# Patient Record
Sex: Female | Born: 1970 | Race: Black or African American | Hispanic: No | Marital: Married | Smoking: Never smoker
Health system: Southern US, Community
[De-identification: ages and names within clinical notes are randomized; demographics above are authoritative.]

## PROBLEM LIST (undated history)

## (undated) DIAGNOSIS — D649 Anemia, unspecified: Secondary | ICD-10-CM

## (undated) HISTORY — PX: ABDOMINAL HYSTERECTOMY: SHX81

## (undated) HISTORY — PX: CHOLECYSTECTOMY: SHX55

---

## 1997-10-31 ENCOUNTER — Ambulatory Visit (HOSPITAL_COMMUNITY): Admission: RE | Admit: 1997-10-31 | Discharge: 1997-11-01 | Payer: Self-pay | Admitting: General Surgery

## 1999-02-17 ENCOUNTER — Other Ambulatory Visit: Admission: RE | Admit: 1999-02-17 | Discharge: 1999-02-17 | Payer: Self-pay | Admitting: Obstetrics and Gynecology

## 1999-02-18 ENCOUNTER — Ambulatory Visit (HOSPITAL_COMMUNITY): Admission: RE | Admit: 1999-02-18 | Discharge: 1999-02-18 | Payer: Self-pay | Admitting: Obstetrics and Gynecology

## 1999-07-20 ENCOUNTER — Other Ambulatory Visit: Admission: RE | Admit: 1999-07-20 | Discharge: 1999-07-20 | Payer: Self-pay | Admitting: Obstetrics and Gynecology

## 1999-12-31 ENCOUNTER — Other Ambulatory Visit: Admission: RE | Admit: 1999-12-31 | Discharge: 1999-12-31 | Payer: Self-pay | Admitting: Obstetrics and Gynecology

## 2000-07-14 ENCOUNTER — Inpatient Hospital Stay (HOSPITAL_COMMUNITY): Admission: AD | Admit: 2000-07-14 | Discharge: 2000-07-16 | Payer: Self-pay | Admitting: Obstetrics and Gynecology

## 2000-08-12 ENCOUNTER — Other Ambulatory Visit: Admission: RE | Admit: 2000-08-12 | Discharge: 2000-08-12 | Payer: Self-pay | Admitting: Obstetrics and Gynecology

## 2001-11-01 ENCOUNTER — Other Ambulatory Visit: Admission: RE | Admit: 2001-11-01 | Discharge: 2001-11-01 | Payer: Self-pay | Admitting: Obstetrics and Gynecology

## 2002-12-27 ENCOUNTER — Other Ambulatory Visit: Admission: RE | Admit: 2002-12-27 | Discharge: 2002-12-27 | Payer: Self-pay | Admitting: Obstetrics and Gynecology

## 2004-08-13 ENCOUNTER — Other Ambulatory Visit: Admission: RE | Admit: 2004-08-13 | Discharge: 2004-08-13 | Payer: Self-pay | Admitting: Obstetrics and Gynecology

## 2005-05-04 ENCOUNTER — Encounter: Admission: RE | Admit: 2005-05-04 | Discharge: 2005-05-04 | Payer: Self-pay | Admitting: Internal Medicine

## 2012-04-30 ENCOUNTER — Emergency Department (HOSPITAL_COMMUNITY): Admission: EM | Admit: 2012-04-30 | Discharge: 2012-04-30 | Discharge status: Absent without leave | Payer: Self-pay

## 2013-05-28 ENCOUNTER — Encounter (HOSPITAL_COMMUNITY): Payer: Self-pay

## 2013-05-31 NOTE — Patient Instructions (Addendum)
Your procedure is scheduled on: 06/11/2013  Enter through the Main Entrance of [facility] at: 0600AM  Pick up the phone at the desk and dial [phone].  Call this number if you have problems the morning of surgery: [phone].  Remember: Do NOT eat food: AFTER MIDNIGHT 06/10/2013 Do NOT drink clear liquids after: AFTER MIDNIGHT 06/10/2013 Take these medicines the morning of surgery with a SIP OF WATER: NONE  Do NOT wear jewelry (body piercing), make-up, or nail polish. Do NOT wear lotions, powders, or perfumes.  You may wear deoderant. Do NOT shave for 48 hours prior to surgery. Do NOT bring valuables to the hospital. Contacts, dentures, or bridgework may not be worn into surgery. Leave suitcase in car.  After surgery it may be brought to your room.  For patients admitted to the hospital, checkout time is 11:00 AM the day of discharge.

## 2013-06-01 ENCOUNTER — Encounter (HOSPITAL_COMMUNITY): Payer: Self-pay

## 2013-06-01 ENCOUNTER — Encounter (HOSPITAL_COMMUNITY)
Admission: RE | Admit: 2013-06-01 | Discharge: 2013-06-01 | Disposition: A | Discharge status: Home / Self Care | Payer: BC Managed Care – PPO | Source: Ambulatory Visit | Attending: Obstetrics and Gynecology | Admitting: Obstetrics and Gynecology

## 2013-06-01 DIAGNOSIS — Z01818 Encounter for other preprocedural examination: Secondary | ICD-10-CM | POA: Insufficient documentation

## 2013-06-01 DIAGNOSIS — Z01812 Encounter for preprocedural laboratory examination: Secondary | ICD-10-CM | POA: Insufficient documentation

## 2013-06-01 HISTORY — DX: Anemia, unspecified: D64.9

## 2013-06-01 LAB — CBC
Hemoglobin: 11.5 g/dL — ABNORMAL LOW (ref 12.0–15.0)
MCH: 25.3 pg — ABNORMAL LOW (ref 26.0–34.0)
MCHC: 32.8 g/dL (ref 30.0–36.0)
MCV: 77.1 fL — ABNORMAL LOW (ref 78.0–100.0)
Platelets: 222 10*3/uL (ref 150–400)
RBC: 4.55 MIL/uL (ref 3.87–5.11)
RDW: 17.6 % — ABNORMAL HIGH (ref 11.5–15.5)
WBC: 5.2 10*3/uL (ref 4.0–10.5)

## 2013-06-10 NOTE — H&P (Addendum)
  [name]   [id number]   05/29/13  S: Ms. Tanya Harrison is a 42 year old G5P2.  Husband has had a vasectomy.  She has been having worsening problems with abnormal uterine bleeding, menorrhagia, which is incapacitating at times.  She does have fibroids approximately 10 weeks' size with sonohysterogram showing a 3.5 cm with submucosal component.  Continues to have problems with anemia with a hemoglobin of 9.2.  With no further childbearing desires, she desires definitive surgical intervention and requests hysterectomy.  She does wish preservation of the ovaries, and she presents for this.  Past medical history:  She has had 2 cesarean sections.  She does have anemia most likely due to her menorrhagia.  She does take iron on a daily basis.  She has no known drug allergies. O: Blood pressure 128/84.  Heart regular rate and rhythm.  Lungs are clear to auscultation bilaterally.  Abdomen is nondistended, nontender.  Pelvic exam:  Uterus is anteverted, mobile, approximately 10 weeks' size.  No significant cystocele or rectocele. A/P: Menorrhagia, fibroids, abnormal uterine bleeding and anemia.  Patient desires definitive surgical intervention.  She requests hysterectomy.  Plan laparoscopically assisted vaginal hysterectomy, possible total abdominal hysterectomy with preservation of the ovaries.  Discussed the risks and benefits of this procedure at length, its success, complications.  The risks include but are not limited to the risk of infection, bleeding, damage to the ovaries, bowel, bladder or ureters.  Risk of blood transfusion.  Possibility of having to convert to laparotomy.  She does give her informed consent.  All of her questions are answered. Tanya Kid Rana Snare, MD/tch   This patient has been seen and examined.   All of her questions were answered.  Labs and vital signs reviewed.  Informed consent has been obtained.  The History and Physical is current. 06/11/13 0730 DL

## 2013-06-11 ENCOUNTER — Encounter (HOSPITAL_COMMUNITY): Payer: BC Managed Care – PPO | Admitting: Anesthesiology

## 2013-06-11 ENCOUNTER — Ambulatory Visit (HOSPITAL_COMMUNITY): Payer: BC Managed Care – PPO | Admitting: Anesthesiology

## 2013-06-11 ENCOUNTER — Encounter (HOSPITAL_COMMUNITY): Payer: Self-pay | Admitting: Anesthesiology

## 2013-06-11 ENCOUNTER — Observation Stay (HOSPITAL_COMMUNITY)
Admission: RE | Admit: 2013-06-11 | Discharge: 2013-06-12 | Disposition: A | Discharge status: Home / Self Care | Payer: BC Managed Care – PPO | Source: Ambulatory Visit | Attending: Obstetrics and Gynecology | Admitting: Obstetrics and Gynecology

## 2013-06-11 ENCOUNTER — Encounter (HOSPITAL_COMMUNITY)
Admission: RE | Disposition: A | Discharge status: Home / Self Care | Payer: Self-pay | Source: Ambulatory Visit | Attending: Obstetrics and Gynecology

## 2013-06-11 DIAGNOSIS — D250 Submucous leiomyoma of uterus: Secondary | ICD-10-CM | POA: Insufficient documentation

## 2013-06-11 DIAGNOSIS — D251 Intramural leiomyoma of uterus: Secondary | ICD-10-CM | POA: Insufficient documentation

## 2013-06-11 DIAGNOSIS — N920 Excessive and frequent menstruation with regular cycle: Principal | ICD-10-CM | POA: Insufficient documentation

## 2013-06-11 DIAGNOSIS — D649 Anemia, unspecified: Secondary | ICD-10-CM | POA: Insufficient documentation

## 2013-06-11 DIAGNOSIS — Z90710 Acquired absence of both cervix and uterus: Secondary | ICD-10-CM

## 2013-06-11 HISTORY — PX: LAPAROSCOPIC ASSISTED VAGINAL HYSTERECTOMY: SHX5398

## 2013-06-11 LAB — PREGNANCY, URINE: Preg Test, Ur: NEGATIVE

## 2013-06-11 SURGERY — HYSTERECTOMY, VAGINAL, LAPAROSCOPY-ASSISTED
Anesthesia: General | Site: Vagina | Wound class: Clean Contaminated

## 2013-06-11 MED ORDER — FENTANYL CITRATE 0.05 MG/ML IJ SOLN
INTRAMUSCULAR | Status: DC | PRN
  Administered 2013-06-11 (×5): 50 ug via INTRAVENOUS

## 2013-06-11 MED ORDER — NALOXONE HCL 0.4 MG/ML IJ SOLN: 0.4000 mg | INTRAMUSCULAR | Status: DC | PRN

## 2013-06-11 MED ORDER — MENTHOL 3 MG MT LOZG: 1.0000 | LOZENGE | OROMUCOSAL | Status: DC | PRN

## 2013-06-11 MED ORDER — PROPOFOL 10 MG/ML IV BOLUS
INTRAVENOUS | Status: DC | PRN
  Administered 2013-06-11: 80 mg via INTRAVENOUS

## 2013-06-11 MED ORDER — SODIUM CHLORIDE 0.9 % IJ SOLN: 9.0000 mL | INTRAMUSCULAR | Status: DC | PRN

## 2013-06-11 MED ORDER — ROCURONIUM BROMIDE 100 MG/10ML IV SOLN
INTRAVENOUS | Status: AC
  Filled 2013-06-11: qty 1

## 2013-06-11 MED ORDER — MIDAZOLAM HCL 2 MG/2ML IJ SOLN
INTRAMUSCULAR | Status: DC | PRN
  Administered 2013-06-11: 2 mg via INTRAVENOUS

## 2013-06-11 MED ORDER — DIPHENHYDRAMINE HCL 12.5 MG/5ML PO ELIX: 12.5000 mg | ORAL_SOLUTION | Freq: Four times a day (QID) | ORAL | Status: DC | PRN

## 2013-06-11 MED ORDER — BUPIVACAINE HCL (PF) 0.25 % IJ SOLN
INTRAMUSCULAR | Status: DC | PRN
  Administered 2013-06-11: 10 mL

## 2013-06-11 MED ORDER — DEXAMETHASONE SODIUM PHOSPHATE 10 MG/ML IJ SOLN
INTRAMUSCULAR | Status: DC | PRN
  Administered 2013-06-11: 10 mg via INTRAVENOUS

## 2013-06-11 MED ORDER — PHENYLEPHRINE HCL 10 MG/ML IJ SOLN
INTRAMUSCULAR | Status: DC | PRN
  Administered 2013-06-11 (×2): 80 ug via INTRAVENOUS

## 2013-06-11 MED ORDER — BUPIVACAINE HCL (PF) 0.25 % IJ SOLN
INTRAMUSCULAR | Status: AC
  Filled 2013-06-11: qty 10

## 2013-06-11 MED ORDER — PROPOFOL 10 MG/ML IV EMUL
INTRAVENOUS | Status: AC
  Filled 2013-06-11: qty 20

## 2013-06-11 MED ORDER — KETOROLAC TROMETHAMINE 30 MG/ML IJ SOLN
INTRAMUSCULAR | Status: AC
  Filled 2013-06-11: qty 1

## 2013-06-11 MED ORDER — LIDOCAINE HCL (CARDIAC) 20 MG/ML IV SOLN
INTRAVENOUS | Status: DC | PRN
  Administered 2013-06-11: 30 mg via INTRAVENOUS

## 2013-06-11 MED ORDER — HYDROMORPHONE HCL PF 1 MG/ML IJ SOLN
INTRAMUSCULAR | Status: AC
  Filled 2013-06-11: qty 1

## 2013-06-11 MED ORDER — IBUPROFEN 600 MG PO TABS
600.0000 mg | ORAL_TABLET | Freq: Four times a day (QID) | ORAL | Status: DC | PRN
  Administered 2013-06-11 – 2013-06-12 (×2): 600 mg via ORAL
  Filled 2013-06-11 (×2): qty 1

## 2013-06-11 MED ORDER — LIDOCAINE HCL (CARDIAC) 20 MG/ML IV SOLN
INTRAVENOUS | Status: AC
  Filled 2013-06-11: qty 5

## 2013-06-11 MED ORDER — ONDANSETRON HCL 4 MG/2ML IJ SOLN
INTRAMUSCULAR | Status: DC | PRN
  Administered 2013-06-11: 4 mg via INTRAVENOUS

## 2013-06-11 MED ORDER — FENTANYL CITRATE 0.05 MG/ML IJ SOLN
INTRAMUSCULAR | Status: AC
  Filled 2013-06-11: qty 5

## 2013-06-11 MED ORDER — GLYCOPYRROLATE 0.2 MG/ML IJ SOLN
INTRAMUSCULAR | Status: DC | PRN
  Administered 2013-06-11: 0.3 mg via INTRAVENOUS

## 2013-06-11 MED ORDER — OXYCODONE-ACETAMINOPHEN 5-325 MG PO TABS: 1.0000 | ORAL_TABLET | ORAL | Status: DC | PRN

## 2013-06-11 MED ORDER — GLYCOPYRROLATE 0.2 MG/ML IJ SOLN
INTRAMUSCULAR | Status: AC
  Filled 2013-06-11: qty 3

## 2013-06-11 MED ORDER — PHENYLEPHRINE 40 MCG/ML (10ML) SYRINGE FOR IV PUSH (FOR BLOOD PRESSURE SUPPORT)
PREFILLED_SYRINGE | INTRAVENOUS | Status: AC
  Filled 2013-06-11: qty 5

## 2013-06-11 MED ORDER — DEXAMETHASONE SODIUM PHOSPHATE 10 MG/ML IJ SOLN
INTRAMUSCULAR | Status: AC
  Filled 2013-06-11: qty 1

## 2013-06-11 MED ORDER — LACTATED RINGERS IV SOLN
INTRAVENOUS | Status: DC
  Administered 2013-06-11 (×2): via INTRAVENOUS

## 2013-06-11 MED ORDER — ONDANSETRON HCL 4 MG/2ML IJ SOLN: 4.0000 mg | Freq: Four times a day (QID) | INTRAMUSCULAR | Status: DC | PRN

## 2013-06-11 MED ORDER — MIDAZOLAM HCL 2 MG/2ML IJ SOLN
INTRAMUSCULAR | Status: AC
  Filled 2013-06-11: qty 2

## 2013-06-11 MED ORDER — HYDROMORPHONE HCL PF 1 MG/ML IJ SOLN: 0.2000 mg | INTRAMUSCULAR | Status: DC | PRN

## 2013-06-11 MED ORDER — DIPHENHYDRAMINE HCL 50 MG/ML IJ SOLN: 12.5000 mg | Freq: Four times a day (QID) | INTRAMUSCULAR | Status: DC | PRN

## 2013-06-11 MED ORDER — NEOSTIGMINE METHYLSULFATE 1 MG/ML IJ SOLN
INTRAMUSCULAR | Status: DC | PRN
  Administered 2013-06-11: 1.5 mg via INTRAVENOUS

## 2013-06-11 MED ORDER — LACTATED RINGERS IR SOLN
Status: DC | PRN
  Administered 2013-06-11: 1

## 2013-06-11 MED ORDER — ZOLPIDEM TARTRATE 5 MG PO TABS: 5.0000 mg | ORAL_TABLET | Freq: Every evening | ORAL | Status: DC | PRN

## 2013-06-11 MED ORDER — ONDANSETRON HCL 4 MG/2ML IJ SOLN
INTRAMUSCULAR | Status: AC
  Filled 2013-06-11: qty 2

## 2013-06-11 MED ORDER — HYDROMORPHONE HCL PF 1 MG/ML IJ SOLN
0.2500 mg | INTRAMUSCULAR | Status: DC | PRN
  Administered 2013-06-11: 0.5 mg via INTRAVENOUS

## 2013-06-11 MED ORDER — ROCURONIUM BROMIDE 100 MG/10ML IV SOLN
INTRAVENOUS | Status: DC | PRN
  Administered 2013-06-11: 50 mg via INTRAVENOUS

## 2013-06-11 MED ORDER — KETOROLAC TROMETHAMINE 30 MG/ML IJ SOLN
INTRAMUSCULAR | Status: DC | PRN
  Administered 2013-06-11: 30 mg via INTRAVENOUS

## 2013-06-11 MED ORDER — DEXTROSE 5 % IV SOLN
2.0000 g | INTRAVENOUS | Status: AC
  Administered 2013-06-11: 2 g via INTRAVENOUS
  Filled 2013-06-11: qty 2

## 2013-06-11 MED ORDER — BUPIVACAINE HCL (PF) 0.25 % IJ SOLN
INTRAMUSCULAR | Status: AC
  Filled 2013-06-11: qty 30

## 2013-06-11 MED ORDER — DEXTROSE-NACL 5-0.45 % IV SOLN
INTRAVENOUS | Status: DC
  Administered 2013-06-11: 13:00:00 via INTRAVENOUS

## 2013-06-11 MED ORDER — HYDROMORPHONE 0.3 MG/ML IV SOLN
INTRAVENOUS | Status: DC
  Administered 2013-06-11: 0.599 mg via INTRAVENOUS
  Administered 2013-06-11: 0.6 mg via INTRAVENOUS
  Administered 2013-06-11: 11:00:00 via INTRAVENOUS
  Filled 2013-06-11: qty 25

## 2013-06-11 MED ORDER — NEOSTIGMINE METHYLSULFATE 1 MG/ML IJ SOLN
INTRAMUSCULAR | Status: AC
  Filled 2013-06-11: qty 1

## 2013-06-11 SURGICAL SUPPLY — 45 items
ADH SKN CLS APL DERMABOND .7 (GAUZE/BANDAGES/DRESSINGS) ×1
ADH SKN CLS LQ APL DERMABOND (GAUZE/BANDAGES/DRESSINGS) ×1
BLADE SURG 15 STRL LF C SS BP (BLADE) ×1 IMPLANT
BLADE SURG 15 STRL SS (BLADE) ×2
CABLE HIGH FREQUENCY MONO STRZ (ELECTRODE) IMPLANT
CATH ROBINSON RED A/P 16FR (CATHETERS) ×2 IMPLANT
CLOTH BEACON ORANGE TIMEOUT ST (SAFETY) ×2 IMPLANT
CONT PATH 16OZ SNAP LID 3702 (MISCELLANEOUS) ×2 IMPLANT
COVER TABLE BACK 60X90 (DRAPES) ×2 IMPLANT
DECANTER SPIKE VIAL GLASS SM (MISCELLANEOUS) IMPLANT
DERMABOND ADHESIVE PROPEN (GAUZE/BANDAGES/DRESSINGS) ×1
DERMABOND ADVANCED (GAUZE/BANDAGES/DRESSINGS) ×1
DERMABOND ADVANCED .7 DNX12 (GAUZE/BANDAGES/DRESSINGS) ×1 IMPLANT
DERMABOND ADVANCED .7 DNX6 (GAUZE/BANDAGES/DRESSINGS) IMPLANT
DRSG COVADERM PLUS 2X2 (GAUZE/BANDAGES/DRESSINGS) ×1 IMPLANT
ELECT LIGASURE LONG (ELECTRODE) ×2 IMPLANT
ELECT REM PT RETURN 9FT ADLT (ELECTROSURGICAL) ×2
ELECTRODE REM PT RTRN 9FT ADLT (ELECTROSURGICAL) IMPLANT
FORCEPS CUTTING 45CM 5MM (CUTTING FORCEPS) ×2 IMPLANT
GLOVE BIO SURGEON STRL SZ8 (GLOVE) ×2 IMPLANT
GLOVE BIOGEL PI IND STRL 6.5 (GLOVE) ×1 IMPLANT
GLOVE BIOGEL PI INDICATOR 6.5 (GLOVE) ×1
GLOVE SURG ORTHO 8.0 STRL STRW (GLOVE) ×6 IMPLANT
GOWN STRL REIN XL XLG (GOWN DISPOSABLE) ×8 IMPLANT
NEEDLE INSUFFLATION 120MM (ENDOMECHANICALS) ×2 IMPLANT
NS IRRIG 1000ML POUR BTL (IV SOLUTION) ×2 IMPLANT
PACK LAVH (CUSTOM PROCEDURE TRAY) ×2 IMPLANT
PROTECTOR NERVE ULNAR (MISCELLANEOUS) ×2 IMPLANT
SET IRRIG TUBING LAPAROSCOPIC (IRRIGATION / IRRIGATOR) ×1 IMPLANT
SOLUTION ELECTROLUBE (MISCELLANEOUS) IMPLANT
STRIP CLOSURE SKIN 1/4X3 (GAUZE/BANDAGES/DRESSINGS) IMPLANT
SUT MNCRL 0 MO-4 VIOLET 18 CR (SUTURE) ×2 IMPLANT
SUT MNCRL 0 VIOLET 6X18 (SUTURE) ×1 IMPLANT
SUT MNCRL AB 0 CT1 27 (SUTURE) ×1 IMPLANT
SUT MON AB 2-0 CT1 36 (SUTURE) IMPLANT
SUT MONOCRYL 0 6X18 (SUTURE) ×1
SUT MONOCRYL 0 MO 4 18  CR/8 (SUTURE) ×2
SUT VICRYL 0 UR6 27IN ABS (SUTURE) ×2 IMPLANT
SUT VICRYL RAPIDE 3 0 (SUTURE) ×2 IMPLANT
TOWEL OR 17X24 6PK STRL BLUE (TOWEL DISPOSABLE) ×4 IMPLANT
TRAY FOLEY CATH 14FR (SET/KITS/TRAYS/PACK) ×2 IMPLANT
TROCAR OPTI TIP 5M 100M (ENDOMECHANICALS) ×2 IMPLANT
TROCAR XCEL DIL TIP R 11M (ENDOMECHANICALS) ×2 IMPLANT
WARMER LAPAROSCOPE (MISCELLANEOUS) ×2 IMPLANT
WATER STERILE IRR 1000ML POUR (IV SOLUTION) ×1 IMPLANT

## 2013-06-11 NOTE — Anesthesia Postprocedure Evaluation (Signed)
Anesthesia Post Note  Patient: Tanya Harrison  Procedure(s) Performed: Procedure(s) (LRB): LAPAROSCOPIC ASSISTED VAGINAL HYSTERECTOMY (N/A)  Anesthesia type: General  Patient location: PACU  Post pain: Pain level controlled  Post assessment: Post-op Vital signs reviewed  Last Vitals:  Filed Vitals:   06/11/13 1015  BP:   Pulse: 83  Temp: 36.9 C  Resp: 16    Post vital signs: Reviewed  Level of consciousness: sedated  Complications: No apparent anesthesia complications

## 2013-06-11 NOTE — Anesthesia Postprocedure Evaluation (Signed)
Anesthesia Post Note  Patient: Tanya Harrison  Procedure(s) Performed: Procedure(s) (LRB): LAPAROSCOPIC ASSISTED VAGINAL HYSTERECTOMY (N/A)  Anesthesia type: General  Patient location: [facility]  Post pain: Pain level controlled  Post assessment: Post-op Vital signs reviewed  Last Vitals:  Filed Vitals:   06/11/13 1407  BP: 118/74  Pulse: 97  Temp: 36.9 C  Resp: 16    Post vital signs: Reviewed  Level of consciousness: sedated  Complications: No apparent anesthesia complications

## 2013-06-11 NOTE — Transfer of Care (Signed)
Immediate Anesthesia Transfer of Care Note  Patient: Tanya Harrison  Procedure(s) Performed: Procedure(s): LAPAROSCOPIC ASSISTED VAGINAL HYSTERECTOMY (N/A)  Patient Location: PACU  Anesthesia Type:General  Level of Consciousness: awake, alert , oriented and patient cooperative  Airway & Oxygen Therapy: Patient Spontanous Breathing and Patient connected to nasal cannula oxygen  Post-op Assessment: Report given to PACU RN and Post -op Vital signs reviewed and stable  Post vital signs: Reviewed and stable  Complications: No apparent anesthesia complications

## 2013-06-11 NOTE — Brief Op Note (Signed)
06/11/2013  9:08 AM  PATIENT:  Tanya Harrison  42 y.o. female  PRE-OPERATIVE DIAGNOSIS:  fibroids aub;menorrhagia;anemia  POST-OPERATIVE DIAGNOSIS:  ibroids aub;menorrhagia;anemia  PROCEDURE:  Procedure(s): LAPAROSCOPIC ASSISTED VAGINAL HYSTERECTOMY (N/A)  SURGEON:  Surgeon(s) and Role:    * Turner Daniels, MD - Primary    * Jeani Hawking, MD - Assisting  PHYSICIAN ASSISTANT:   ASSISTANTS: Grewal   ANESTHESIA:   local and general  EBL:  Total I/O In: 1500 [I.V.:1500] Out: 250 [Urine:50; Blood:200]  BLOOD ADMINISTERED:none  DRAINS: Urinary Catheter (Foley)   LOCAL MEDICATIONS USED:  MARCAINE     SPECIMEN:  Source of Specimen:  uterus  DISPOSITION OF SPECIMEN:  PATHOLOGY  COUNTS:  YES  TOURNIQUET:  * No tourniquets in log *  DICTATION: .Other Dictation: Dictation Number 1  PLAN OF CARE: Admit for overnight observation  PATIENT DISPOSITION:  PACU - hemodynamically stable.   Delay start of Pharmacological VTE agent (>24hrs) due to surgical blood loss or risk of bleeding: not applicable

## 2013-06-11 NOTE — Anesthesia Preprocedure Evaluation (Signed)

## 2013-06-12 ENCOUNTER — Encounter (HOSPITAL_COMMUNITY): Payer: Self-pay | Admitting: Obstetrics and Gynecology

## 2013-06-12 LAB — CBC
HCT: 29.6 % — ABNORMAL LOW (ref 36.0–46.0)
Hemoglobin: 9.7 g/dL — ABNORMAL LOW (ref 12.0–15.0)
MCH: 25.6 pg — ABNORMAL LOW (ref 26.0–34.0)
MCV: 78.1 fL (ref 78.0–100.0)
RBC: 3.79 MIL/uL — ABNORMAL LOW (ref 3.87–5.11)
WBC: 10.3 10*3/uL (ref 4.0–10.5)

## 2013-06-12 MED ORDER — IBUPROFEN 600 MG PO TABS: 600.0000 mg | ORAL_TABLET | Freq: Four times a day (QID) | ORAL | Status: AC | PRN

## 2013-06-12 MED ORDER — OXYCODONE-ACETAMINOPHEN 5-325 MG PO TABS: 1.0000 | ORAL_TABLET | ORAL | Status: DC | PRN

## 2013-06-12 NOTE — Discharge Summary (Signed)
Physician Discharge Summary  Patient ID: SHARMAIN LASTRA MRN: [id number] DOB/AGE: June 06, 1971 42 y.o.  Admit date: 06/11/2013 Discharge date: 06/12/2013  Admission Diagnoses:  Discharge Diagnoses:  Active Problems:   S/P laparoscopic assisted vaginal hysterectomy (LAVH)   Discharged Condition: good  Hospital Course: pt underwent uncomplicated LAVH.  Her post op care was unremarkable with quick return of bowel and bladder function.  Her pain was well controlled with motrin only and on post op D 1 was tolerating a regular diet, ambulating without difficulty and wanted d/c home  Consults: None  Significant Diagnostic Studies: labs: 9.7  Treatments: surgery: LAVH  Discharge Exam: Blood pressure 100/65, pulse 79, temperature 98.8 F (37.1 C), temperature source Oral, resp. rate 16, height 5' (1.524 m), weight 79.379 kg (175 lb), last menstrual period 05/29/2013, SpO2 96.00%. General appearance: alert, cooperative, appears stated age and no distress GI: normal findings: bowel sounds normal Incision/Wound:CD&I  Disposition:   Discharge Orders   Future Orders Complete By Expires   Call MD for:  difficulty breathing, headache or visual disturbances  As directed    Call MD for:  persistant nausea and vomiting  As directed    Call MD for:  redness, tenderness, or signs of infection (pain, swelling, redness, odor or green/yellow discharge around incision site)  As directed    Call MD for:  severe uncontrolled pain  As directed    Call MD for:  temperature >100.4  As directed    Diet general  As directed    Driving Restrictions  As directed    Comments:     No driving for 1 week   Increase activity slowly  As directed    Lifting restrictions  As directed    Comments:     No lifting anything greater than 10 pounds (if you have to ask, don't lift it)   Sexual Activity Restrictions  As directed    Comments:     Nothing in the vagina for 6 weeks       Medication List         ibuprofen 600 MG tablet  Commonly known as:  ADVIL,MOTRIN  Take 1 tablet (600 mg total) by mouth every 6 (six) hours as needed (mild pain).     INTEGRA F 125-1 MG Caps  Take 1 capsule by mouth daily.     naproxen sodium 220 MG tablet  Commonly known as:  ANAPROX  Take 440 mg by mouth daily as needed (menstrual cramps).     oxyCODONE-acetaminophen 5-325 MG per tablet  Commonly known as:  PERCOCET/ROXICET  Take 1-2 tablets by mouth every 4 (four) hours as needed for severe pain (moderate to severe pain (when tolerating fluids)).         Signed: Turner Daniels 06/12/2013, 9:31 AM

## 2013-06-12 NOTE — Op Note (Signed)
NAMEBRUNILDA, EBLE           ACCOUNT NO.:  [id number]  MEDICAL RECORD NO.:  [id number]  LOCATION:  9316                          FACILITY:  [facility]  PHYSICIAN:  Dineen Kid. Rana Snare, M.D.    DATE OF BIRTH:  1971-05-26  DATE OF PROCEDURE:  06/11/2013 DATE OF DISCHARGE:  06/12/2013                              OPERATIVE REPORT   PREOPERATIVE DIAGNOSES:  Menorrhagia, 10-week size fibroids, abnormal uterine bleeding, and anemia.  POSTOPERATIVE DIAGNOSES:  Menorrhagia, 10-week size fibroids, abnormal uterine bleeding, and anemia.  PROCEDURE:  Laparoscopic-assisted vaginal hysterectomy.  SURGEON:  Dineen Kid. Rana Snare, M.D.  ASSISTANT:  Stann Mainland. Vincente Poli, M.D.  INDICATIONS:  Ms. Feazell is a 42 year old, G5, P2.  Husband had a vasectomy.  She has had worsening problems with abnormal uterine bleeding, menorrhagia which is incapacitating at times.  She has known fibroids with the uterus of 10-week size.  Sonohysterogram shows a 3.5 cm submucosal component of fibroid.  Hemoglobin has been as low as 9.2, currently on intensive iron therapy.  She has no further childbearing desires.  She requests hysterectomy.  Previous cesarean sections x2.  We are going to attempt laparoscopic-assisted vaginal hysterectomy.  Risks and benefits were discussed at length.  Informed consent was obtained.  FINDINGS:  At the time of surgery, enlarged globular fibroid uterus of approximately 10-week size.  Normal appearing ovaries, appendix, gallbladder, and liver.  DESCRIPTION OF PROCEDURE:  After adequate analgesia, the patient was placed in dorsal lithotomy position.  She was sterilely prepped and draped.  Bladder was sterilely drained.  A Hulka tenaculum was placed on the cervix.  A 1-cm infraumbilical skin incision was made.  A Veress needle was inserted.  The abdomen was insufflated dullness to percussion.  An 11-mm trocar was inserted.  The above findings were noted by laparoscope.  A 5-mm trocar was  inserted to left of the midline 2 fingerbreadths above the pubic symphysis under direct visualization. After careful and systematic evaluation of the abdomen and pelvis, the Gyrus cutting forceps used to ligate and dissect across the utero- ovarian ligaments bilaterally, fallopian tubes bilaterally, round ligaments bilaterally down to the inferior portions of the broad ligament with the left and right fallopian tubes falling laterally. Good hemostasis was achieved.  Care taken to avoid the underlying bowel and also to avoid the underlying ureter.  The bladder was then elevated and a small bladder flap was created at the uterovesical junction.  The abdomen was then desufflated, legs were repositioned.  Weighted speculum was placed in the vagina.  Posterior colpotomy was performed.  The cervix was then circumscribed with Bovie cautery.  LigaSure instrument used to ligate across the uterosacral ligaments bilaterally, cardinal ligaments bilaterally.  The anterior vaginal mucosa was dissected off the anterior surface of the cervix and anterior peritoneum was entered just below the bladder and Deaver retractor was placed.  The LigaSure instrument was used to ligate across uterine vasculature bilaterally, dissected with Mayo scissors up to the inferior portion of the broad ligaments.  The uterus was then removed intact.  Suture of 0 Monocryl suture was placed at the uterosacral ligaments bilaterally.  Posterior peritoneum was then closed in a pursestring fashion using 0 Monocryl suture.  The  uterosacral ligaments were plicated in midline using figure- of-eights of 0 Monocryl suture.  The anterior vaginal mucosa was then closed in a vertical fashion again using figure-of-eights of 0 Monocryl suture with good approximation.  Good hemostasis was noted.  Foley catheter was placed with good return of clear yellow urine.  Legs were repositioned.  Abdomen re-insufflated with a sharp suction  irrigator, was used to irrigate the abdomen and pelvis.  Reexamination of all pedicles revealed good hemostasis.  Some small peritoneal edge bleeders were coagulated with bipolar cautery.  Good hemostasis achieved.  After copious amount of irrigation, adequate hemostasis was assured.  Ovaries appeared to be normal.  Good support and hemostasis in the cul-de-sac. Ureters were identified with good peristalsis away from the pedicles. Abdomen was then desufflated.  Trocars removed.  The infraumbilical skin incision was closed with 0 Vicryl interrupted suture to the fascia, 3-0 Vicryl Rapide subcuticular suture.  The 5-mm site was closed with a 3-0 Vicryl Rapide in a horizontal mattress due to some small bleeding. Dermabond was then placed.  Incisions were injected with 0.25% Marcaine, total of 10 mL used.  The patient was stable and transferred to recovery room.  Sponge, instrument, and needle count was normal x3.  Estimated blood loss, 200 mL.  The patient received 2 g of cefotetan preoperatively.     Dineen Kid Rana Snare, M.D.     DCL/MEDQ  D:  06/11/2013  T:  06/12/2013  Job:  [id number]

## 2013-06-12 NOTE — Progress Notes (Signed)
Pt discharged to home with husband.  Condition stable.  Pt ambulated to car with RN.  No equipment for home ordered at discharge. 

## 2016-11-12 DIAGNOSIS — Z01419 Encounter for gynecological examination (general) (routine) without abnormal findings: Secondary | ICD-10-CM | POA: Diagnosis not present

## 2017-07-29 DIAGNOSIS — L258 Unspecified contact dermatitis due to other agents: Secondary | ICD-10-CM | POA: Diagnosis not present

## 2017-09-08 DIAGNOSIS — M546 Pain in thoracic spine: Secondary | ICD-10-CM | POA: Diagnosis not present

## 2018-02-10 DIAGNOSIS — Z9109 Other allergy status, other than to drugs and biological substances: Secondary | ICD-10-CM | POA: Diagnosis not present

## 2018-02-10 DIAGNOSIS — R599 Enlarged lymph nodes, unspecified: Secondary | ICD-10-CM | POA: Diagnosis not present

## 2018-03-24 DIAGNOSIS — R599 Enlarged lymph nodes, unspecified: Secondary | ICD-10-CM

## 2018-03-24 DIAGNOSIS — Z1211 Encounter for screening for malignant neoplasm of colon: Secondary | ICD-10-CM | POA: Diagnosis not present

## 2018-04-12 ENCOUNTER — Telehealth: Payer: Self-pay

## 2018-04-12 NOTE — Telephone Encounter (Signed)
Kidney functions are normal.  Hgb is normal, no infection present. Negative mono and negative HIV.  The lymph gland swelling is likely a response to the allergies.  Left pt multiple v/ms to call office regarding labs. YRL,RMA

## 2018-05-25 DIAGNOSIS — Z8379 Family history of other diseases of the digestive system: Secondary | ICD-10-CM | POA: Diagnosis not present

## 2018-05-25 DIAGNOSIS — Z1211 Encounter for screening for malignant neoplasm of colon: Secondary | ICD-10-CM | POA: Diagnosis not present

## 2018-05-25 DIAGNOSIS — Z800 Family history of malignant neoplasm of digestive organs: Secondary | ICD-10-CM | POA: Diagnosis not present

## 2018-06-16 ENCOUNTER — Encounter: Payer: Self-pay | Admitting: Nurse Practitioner

## 2018-06-22 ENCOUNTER — Telehealth: Payer: Self-pay | Admitting: Nurse Practitioner

## 2018-06-22 NOTE — Telephone Encounter (Signed)
PT LVM WANTING TO CANCEL APPT FOR 12/13 AFTER REVIEWING PT CHART, PT WAS SCHEDULED FOR 12/6 APPT.ATT TO CONTACT PT TO ADVISE NO ANS UNABLE TO LVM

## 2018-06-23 DIAGNOSIS — Z01419 Encounter for gynecological examination (general) (routine) without abnormal findings: Secondary | ICD-10-CM | POA: Diagnosis not present

## 2018-06-23 DIAGNOSIS — Z6837 Body mass index (BMI) 37.0-37.9, adult: Secondary | ICD-10-CM | POA: Diagnosis not present

## 2018-06-26 ENCOUNTER — Other Ambulatory Visit: Payer: Self-pay | Admitting: Obstetrics and Gynecology

## 2018-06-26 DIAGNOSIS — R928 Other abnormal and inconclusive findings on diagnostic imaging of breast: Secondary | ICD-10-CM

## 2018-07-03 ENCOUNTER — Other Ambulatory Visit: Payer: Self-pay | Admitting: Obstetrics and Gynecology

## 2018-07-03 ENCOUNTER — Ambulatory Visit
Admission: RE | Admit: 2018-07-03 | Discharge: 2018-07-03 | Disposition: A | Discharge status: Home / Self Care | Payer: 59 | Source: Ambulatory Visit | Attending: Obstetrics and Gynecology | Admitting: Obstetrics and Gynecology

## 2018-07-03 DIAGNOSIS — N6489 Other specified disorders of breast: Secondary | ICD-10-CM | POA: Diagnosis not present

## 2018-07-03 DIAGNOSIS — R928 Other abnormal and inconclusive findings on diagnostic imaging of breast: Secondary | ICD-10-CM | POA: Diagnosis not present

## 2018-07-07 ENCOUNTER — Ambulatory Visit
Admission: RE | Admit: 2018-07-07 | Discharge: 2018-07-07 | Disposition: A | Discharge status: Home / Self Care | Payer: 59 | Source: Ambulatory Visit | Attending: Obstetrics and Gynecology | Admitting: Obstetrics and Gynecology

## 2018-07-07 DIAGNOSIS — N6312 Unspecified lump in the right breast, upper inner quadrant: Secondary | ICD-10-CM | POA: Diagnosis not present

## 2018-07-07 DIAGNOSIS — R928 Other abnormal and inconclusive findings on diagnostic imaging of breast: Secondary | ICD-10-CM

## 2018-07-07 DIAGNOSIS — N6489 Other specified disorders of breast: Secondary | ICD-10-CM | POA: Diagnosis not present

## 2018-07-24 ENCOUNTER — Ambulatory Visit: Payer: Self-pay | Admitting: Surgery

## 2018-07-24 DIAGNOSIS — N6310 Unspecified lump in the right breast, unspecified quadrant: Secondary | ICD-10-CM

## 2018-07-24 NOTE — H&P (Signed)
Tanya Harrison Documented: 07/24/2018 10:20 AM Location: [facility] Patient #: [id number] DOB: 10/18/1970 Married / Language: Tanya Harrison / Race: Undefined Female  History of Present Illness Tanya Moores A. Magan Winnett MD; 07/24/2018 10:58 AM) Patient words: Patient sent at the request of Dr. Corinna Capra after undergoing workup for right breast pain 5 months. The patient noticed some right breast discomfort which prompted a mammogram and subsequent ultrasound biopsy. There is an area in the right breast upper quadrant with a vague density only seen on the craniocaudal views. Core biopsy was done which showed a complex sclerosing lesion. The patient denies any history of nipple discharge or breast mass.                      Diagnosis Breast, right, needle core biopsy, upper inner quadrant - COMPLEX SCLEROSING LESION. SEE NOTE. - NEGATIVE FOR CARCINOMA.   Patient was called back for possible distortion in the medial right breast on the CC view only.  EXAM: DIGITAL DIAGNOSTIC RIGHT MAMMOGRAM WITH CAD AND TOMO  ULTRASOUND RIGHT BREAST  COMPARISON: Previous exam(s).  ACR Breast Density Category b: There are scattered areas of fibroglandular density.  FINDINGS: There is continued possible distortion in the medial right breast on the CC view. This is thought to be in the upper inner quadrant based on 3D imaging.  Mammographic images were processed with CAD.  On physical exam, no suspicious lumps are identified.  Targeted ultrasound is performed, showing no sonographic correlate to the possible right breast distortion.  IMPRESSION: Possible distortion in the medial right breast only seen on the CC view.  RECOMMENDATION: Recommend attempted stereotactic biopsy of the possible distortion in the medial right breast.  I have discussed the findings and recommendations with the patient. Results were also provided in writing at the conclusion of the visit. If  applicable, a reminder letter will be sent to the patient regarding the next appointment.  BI-RADS CATEGORY 4: Suspicious.   Electronically Signed By: Tanya Harrison M.D On: 07/03/2018 15:30.  The patient is a 48 year old female.   Past Surgical History Tanya Harrison; 07/24/2018 10:20 AM) Breast Biopsy Right. Cesarean Section - Multiple  Diagnostic Studies History Tanya Harrison; 07/24/2018 10:20 AM) Colonoscopy never Mammogram 1-3 years ago  Allergies Tanya Harrison; 07/24/2018 10:21 AM) No Known Allergies [07/24/2018]: No Known Drug Allergies [07/24/2018]: Allergies Reconciled  Medication History Tanya Harrison; 07/24/2018 10:21 AM) No Current Medications Medications Reconciled  Social History Tanya Harrison; 07/24/2018 10:20 AM) Alcohol use Occasional alcohol use. No caffeine use No drug use Tobacco use Never smoker.  Family History Tanya Pew, [name]; 07/24/2018 10:20 AM) Cerebrovascular Accident Mother. Colon Cancer Brother. Diabetes Mellitus Mother. Heart disease in female family member before age 77 Hypertension Mother.  Pregnancy / Birth History Tanya Pew, [place]; 07/24/2018 10:20 AM) Age at menarche 60 years.  Other Problems Tanya Harrison; 07/24/2018 10:20 AM) Lump In Breast     Review of Systems (Tanya Bohne A. Annina Piotrowski MD; 07/24/2018 10:59 AM) General Not Present- Appetite Loss, Chills, Fatigue, Fever, Night Sweats, Weight Gain and Weight Loss. Skin Not Present- Change in Wart/Mole, Dryness, Hives, Jaundice, New Lesions, Non-Healing Wounds, Rash and Ulcer. HEENT Present- Wears glasses/contact lenses. Not Present- Earache, Hearing Loss, Hoarseness, Nose Bleed, Oral Ulcers, Ringing in the Ears, Seasonal Allergies, Sinus Pain, Sore Throat, Visual Disturbances and Yellow Eyes. Respiratory Not Present- Bloody sputum, Chronic Cough, Difficulty Breathing, Snoring and Wheezing. Musculoskeletal Not Present-  Back Pain, Joint  Pain, Joint Stiffness, Muscle Pain, Muscle Weakness and Swelling of Extremities. All other systems negative  Vitals (Tanya Harrison Harrison; 07/24/2018 10:22 AM) 07/24/2018 10:22 AM Weight: 184.38 lb Height: 60in Body Surface Area: 1.8 m Body Mass Index: 36.01 kg/m  Temp.: 65F(Temporal)  Pulse: 99 (Regular)  BP: 148/98 (Sitting, Right Arm, Standard)      Physical Exam (Tanya Colquhoun A. Shannah Conteh MD; 07/24/2018 10:59 AM)  General Mental Status-Alert. General Appearance-Consistent with stated age. Hydration-Well hydrated. Voice-Normal.  Eye Eyeball - Bilateral-Extraocular movements intact. Sclera/Conjunctiva - Bilateral-No scleral icterus.  Chest and Lung Exam Chest and lung exam reveals -quiet, even and easy respiratory effort with no use of accessory muscles and on auscultation, normal breath sounds, no adventitious sounds and normal vocal resonance. Inspection Chest Wall - Normal. Back - normal.  Breast Note: Bruising right breast upper central portion. No masses in either breast.  Cardiovascular Cardiovascular examination reveals -normal heart sounds, regular rate and rhythm with no murmurs and normal pedal pulses bilaterally.  Neurologic Neurologic evaluation reveals -alert and oriented x 3 with no impairment of recent or remote memory. Mental Status-Normal.  Lymphatic Head & Neck  General Head & Neck Lymphatics: Bilateral - Description - Normal. Axillary  General Axillary Region: Bilateral - Description - Normal. Tenderness - Non Tender.    Assessment & Plan (Tanya Widrig A. Anavey Coombes MD; 07/24/2018 10:37 AM)  BREAST MASS, RIGHT (N63.10) Impression: CSL  Discussed observation versus lumpectomy. Pros and cons of pathophysiology discussed. She has opted for right breast seed localized lumpectomy over observation. Risk of lumpectomy include bleeding, infection, seroma, more surgery, use of seed/wire, wound care, cosmetic deformity  and the need for other treatments, death , blood clots, death. Pt agrees to proceed.  Current Plans You are being scheduled for surgery- Our schedulers will call you.  You should hear from our office's scheduling department within 5 working days about the location, date, and time of surgery. We try to make accommodations for patient's preferences in scheduling surgery, but sometimes the OR schedule or the surgeon's schedule prevents [place] from making those accommodations.  If you have not heard from our office [phone] in 5 working days, call the office and ask for your surgeon's nurse.  If you have other questions about your diagnosis, plan, or surgery, call the office and ask for your surgeon's nurse.  Pt Education - [facility] Breast Biopsy HCI: discussed with patient and provided information.

## 2018-07-26 ENCOUNTER — Other Ambulatory Visit: Payer: Self-pay | Admitting: Surgery

## 2018-07-26 DIAGNOSIS — N6310 Unspecified lump in the right breast, unspecified quadrant: Secondary | ICD-10-CM

## 2018-07-31 ENCOUNTER — Encounter: Payer: Self-pay | Admitting: Nurse Practitioner

## 2018-09-07 ENCOUNTER — Other Ambulatory Visit: Payer: Self-pay

## 2018-09-07 ENCOUNTER — Encounter (HOSPITAL_BASED_OUTPATIENT_CLINIC_OR_DEPARTMENT_OTHER): Payer: Self-pay | Admitting: *Deleted

## 2018-09-08 ENCOUNTER — Encounter (HOSPITAL_BASED_OUTPATIENT_CLINIC_OR_DEPARTMENT_OTHER)
Admission: RE | Admit: 2018-09-08 | Discharge: 2018-09-08 | Disposition: A | Discharge status: Home / Self Care | Payer: 59 | Source: Ambulatory Visit | Attending: Surgery | Admitting: Surgery

## 2018-09-08 DIAGNOSIS — Z01812 Encounter for preprocedural laboratory examination: Secondary | ICD-10-CM | POA: Diagnosis present

## 2018-09-08 LAB — CBC WITH DIFFERENTIAL/PLATELET
Abs Immature Granulocytes: 0.04 10*3/uL (ref 0.00–0.07)
Basophils Absolute: 0.1 10*3/uL (ref 0.0–0.1)
Basophils Relative: 1 %
Eosinophils Absolute: 0.3 10*3/uL (ref 0.0–0.5)
Eosinophils Relative: 4 %
HCT: 38.7 % (ref 36.0–46.0)
Hemoglobin: 12.3 g/dL (ref 12.0–15.0)
Immature Granulocytes: 1 %
Lymphocytes Relative: 36 %
Lymphs Abs: 2.7 10*3/uL (ref 0.7–4.0)
MCH: 27 pg (ref 26.0–34.0)
MCHC: 31.8 g/dL (ref 30.0–36.0)
MCV: 85.1 fL (ref 80.0–100.0)
Monocytes Absolute: 0.7 10*3/uL (ref 0.1–1.0)
Monocytes Relative: 9 %
NRBC: 0 % (ref 0.0–0.2)
Neutro Abs: 3.9 10*3/uL (ref 1.7–7.7)
Neutrophils Relative %: 49 %
Platelets: 178 10*3/uL (ref 150–400)
RBC: 4.55 MIL/uL (ref 3.87–5.11)
RDW: 13.2 % (ref 11.5–15.5)
WBC: 7.6 10*3/uL (ref 4.0–10.5)

## 2018-09-08 LAB — COMPREHENSIVE METABOLIC PANEL
ALT: 15 U/L (ref 0–44)
AST: 15 U/L (ref 15–41)
Albumin: 3.6 g/dL (ref 3.5–5.0)
Alkaline Phosphatase: 56 U/L (ref 38–126)
Anion gap: 7 (ref 5–15)
BUN: 5 mg/dL — AB (ref 6–20)
CO2: 27 mmol/L (ref 22–32)
Calcium: 9.3 mg/dL (ref 8.9–10.3)
Chloride: 102 mmol/L (ref 98–111)
Creatinine, Ser: 0.58 mg/dL (ref 0.44–1.00)
GFR calc Af Amer: >60 mL/min (ref >60)
GFR calc non Af Amer: >60 mL/min (ref >60)
Glucose, Bld: 76 mg/dL (ref 70–99)
POTASSIUM: 4.1 mmol/L (ref 3.5–5.1)
Sodium: 136 mmol/L (ref 135–145)
Total Bilirubin: 0.8 mg/dL (ref 0.3–1.2)
Total Protein: 6.8 g/dL (ref 6.5–8.1)

## 2018-09-08 NOTE — Progress Notes (Signed)
Ensure Pre-surgery drink given to patient with instructions to complete by 0415 DOS.  Surgical scrub also given to patient with instructions for use.  Patient verbalized understanding of instructions.

## 2018-09-13 ENCOUNTER — Ambulatory Visit
Admission: RE | Admit: 2018-09-13 | Discharge: 2018-09-13 | Disposition: A | Discharge status: Home / Self Care | Payer: 59 | Source: Ambulatory Visit | Attending: Surgery | Admitting: Surgery

## 2018-09-13 DIAGNOSIS — N6310 Unspecified lump in the right breast, unspecified quadrant: Secondary | ICD-10-CM

## 2018-09-13 NOTE — Anesthesia Preprocedure Evaluation (Addendum)
Anesthesia Evaluation  Patient identified by MRN, date of birth, ID band Patient awake    Reviewed: Allergy & Precautions, NPO status , Patient's Chart, lab work & pertinent test results  History of Anesthesia Complications Negative for: history of anesthetic complications  Airway Mallampati: II  TM Distance: >3 FB Neck ROM: Full    Dental  (+) Teeth Intact, Dental Advisory Given   Pulmonary neg pulmonary ROS,    Pulmonary exam normal breath sounds clear to auscultation       Cardiovascular negative cardio ROS Normal cardiovascular exam Rhythm:Regular Rate:Normal     Neuro/Psych negative neurological ROS     GI/Hepatic negative GI ROS, Neg liver ROS,   Endo/Other  Obese, BMI 34  Renal/GU negative Renal ROS     Musculoskeletal negative musculoskeletal ROS (+)   Abdominal   Peds  Hematology negative hematology ROS (+)   Anesthesia Other Findings Day of surgery medications reviewed with the patient.  Right breast mass  Reproductive/Obstetrics                            Anesthesia Physical Anesthesia Plan  ASA: II  Anesthesia Plan: General   Post-op Pain Management:    Induction: Intravenous  PONV Risk Score and Plan: 3 and Treatment may vary due to age or medical condition, Ondansetron, Dexamethasone and Midazolam  Airway Management Planned: LMA  Additional Equipment: None  Intra-op Plan:   Post-operative Plan: Extubation in OR  Informed Consent: I have reviewed the patients History and Physical, chart, labs and discussed the procedure including the risks, benefits and alternatives for the proposed anesthesia with the patient or authorized representative who has indicated his/her understanding and acceptance.     Dental advisory given  Plan Discussed with:   Anesthesia Plan Comments:        Anesthesia Quick Evaluation

## 2018-09-14 ENCOUNTER — Other Ambulatory Visit: Payer: Self-pay

## 2018-09-14 ENCOUNTER — Encounter (HOSPITAL_BASED_OUTPATIENT_CLINIC_OR_DEPARTMENT_OTHER)
Admission: RE | Disposition: A | Discharge status: Home / Self Care | Payer: Self-pay | Source: Ambulatory Visit | Attending: Surgery

## 2018-09-14 ENCOUNTER — Ambulatory Visit (HOSPITAL_BASED_OUTPATIENT_CLINIC_OR_DEPARTMENT_OTHER)
Admission: RE | Admit: 2018-09-14 | Discharge: 2018-09-14 | Disposition: A | Discharge status: Home / Self Care | Payer: 59 | Source: Ambulatory Visit | Attending: Surgery | Admitting: Surgery

## 2018-09-14 ENCOUNTER — Ambulatory Visit
Admission: RE | Admit: 2018-09-14 | Discharge: 2018-09-14 | Disposition: A | Discharge status: Home / Self Care | Payer: 59 | Source: Ambulatory Visit | Attending: Surgery | Admitting: Surgery

## 2018-09-14 ENCOUNTER — Ambulatory Visit (HOSPITAL_BASED_OUTPATIENT_CLINIC_OR_DEPARTMENT_OTHER): Payer: 59 | Admitting: Anesthesiology

## 2018-09-14 ENCOUNTER — Encounter (HOSPITAL_BASED_OUTPATIENT_CLINIC_OR_DEPARTMENT_OTHER): Payer: Self-pay | Admitting: *Deleted

## 2018-09-14 DIAGNOSIS — Z6834 Body mass index (BMI) 34.0-34.9, adult: Secondary | ICD-10-CM | POA: Diagnosis not present

## 2018-09-14 DIAGNOSIS — N6011 Diffuse cystic mastopathy of right breast: Secondary | ICD-10-CM | POA: Diagnosis not present

## 2018-09-14 DIAGNOSIS — Z8249 Family history of ischemic heart disease and other diseases of the circulatory system: Secondary | ICD-10-CM | POA: Diagnosis not present

## 2018-09-14 DIAGNOSIS — N6310 Unspecified lump in the right breast, unspecified quadrant: Secondary | ICD-10-CM

## 2018-09-14 DIAGNOSIS — N6489 Other specified disorders of breast: Secondary | ICD-10-CM | POA: Diagnosis present

## 2018-09-14 DIAGNOSIS — E669 Obesity, unspecified: Secondary | ICD-10-CM | POA: Diagnosis not present

## 2018-09-14 DIAGNOSIS — Z833 Family history of diabetes mellitus: Secondary | ICD-10-CM | POA: Insufficient documentation

## 2018-09-14 HISTORY — PX: BREAST EXCISIONAL BIOPSY: SUR124

## 2018-09-14 HISTORY — PX: BREAST LUMPECTOMY WITH RADIOACTIVE SEED LOCALIZATION: SHX6424

## 2018-09-14 SURGERY — BREAST LUMPECTOMY WITH RADIOACTIVE SEED LOCALIZATION
Anesthesia: General | Site: Breast | Laterality: Right

## 2018-09-14 MED ORDER — OXYCODONE HCL 5 MG PO TABS: 5.0000 mg | ORAL_TABLET | Freq: Once | ORAL | Status: DC | PRN

## 2018-09-14 MED ORDER — CHLORHEXIDINE GLUCONATE CLOTH 2 % EX PADS: 6.0000 | MEDICATED_PAD | Freq: Once | CUTANEOUS | Status: DC

## 2018-09-14 MED ORDER — PROPOFOL 500 MG/50ML IV EMUL
INTRAVENOUS | Status: AC
  Filled 2018-09-14: qty 50

## 2018-09-14 MED ORDER — PROMETHAZINE HCL 25 MG/ML IJ SOLN: 6.2500 mg | INTRAMUSCULAR | Status: DC | PRN

## 2018-09-14 MED ORDER — CEFAZOLIN SODIUM-DEXTROSE 2-4 GM/100ML-% IV SOLN
INTRAVENOUS | Status: AC
  Filled 2018-09-14: qty 100

## 2018-09-14 MED ORDER — MIDAZOLAM HCL 2 MG/2ML IJ SOLN
1.0000 mg | INTRAMUSCULAR | Status: DC | PRN
  Administered 2018-09-14: 2 mg via INTRAVENOUS

## 2018-09-14 MED ORDER — BUPIVACAINE-EPINEPHRINE (PF) 0.25% -1:200000 IJ SOLN
INTRAMUSCULAR | Status: DC | PRN
  Administered 2018-09-14: 20 mL

## 2018-09-14 MED ORDER — GABAPENTIN 300 MG PO CAPS
300.0000 mg | ORAL_CAPSULE | ORAL | Status: AC
  Administered 2018-09-14: 300 mg via ORAL

## 2018-09-14 MED ORDER — FENTANYL CITRATE (PF) 100 MCG/2ML IJ SOLN: 50.0000 ug | INTRAMUSCULAR | Status: DC | PRN

## 2018-09-14 MED ORDER — ACETAMINOPHEN 500 MG PO TABS
1000.0000 mg | ORAL_TABLET | ORAL | Status: AC
  Administered 2018-09-14: 1000 mg via ORAL

## 2018-09-14 MED ORDER — PHENYLEPHRINE HCL 10 MG/ML IJ SOLN
INTRAMUSCULAR | Status: DC | PRN
  Administered 2018-09-14 (×3): 80 ug via INTRAVENOUS

## 2018-09-14 MED ORDER — FENTANYL CITRATE (PF) 100 MCG/2ML IJ SOLN
INTRAMUSCULAR | Status: AC
  Filled 2018-09-14: qty 2

## 2018-09-14 MED ORDER — ONDANSETRON HCL 4 MG/2ML IJ SOLN
INTRAMUSCULAR | Status: AC
  Filled 2018-09-14: qty 2

## 2018-09-14 MED ORDER — DEXAMETHASONE SODIUM PHOSPHATE 10 MG/ML IJ SOLN
INTRAMUSCULAR | Status: AC
  Filled 2018-09-14: qty 1

## 2018-09-14 MED ORDER — LACTATED RINGERS IV SOLN
INTRAVENOUS | Status: DC
  Administered 2018-09-14: 07:00:00 via INTRAVENOUS

## 2018-09-14 MED ORDER — ACETAMINOPHEN 500 MG PO TABS
ORAL_TABLET | ORAL | Status: AC
  Filled 2018-09-14: qty 2

## 2018-09-14 MED ORDER — LIDOCAINE 2% (20 MG/ML) 5 ML SYRINGE
INTRAMUSCULAR | Status: AC
  Filled 2018-09-14: qty 5

## 2018-09-14 MED ORDER — OXYCODONE HCL 5 MG/5ML PO SOLN: 5.0000 mg | Freq: Once | ORAL | Status: DC | PRN

## 2018-09-14 MED ORDER — PROPOFOL 10 MG/ML IV BOLUS
INTRAVENOUS | Status: DC | PRN
  Administered 2018-09-14: 170 mg via INTRAVENOUS

## 2018-09-14 MED ORDER — BUPIVACAINE-EPINEPHRINE (PF) 0.25% -1:200000 IJ SOLN
INTRAMUSCULAR | Status: AC
  Filled 2018-09-14: qty 120

## 2018-09-14 MED ORDER — HYDROCODONE-ACETAMINOPHEN 5-325 MG PO TABS: 1.0000 | ORAL_TABLET | Freq: Four times a day (QID) | ORAL | 0 refills | Status: AC | PRN

## 2018-09-14 MED ORDER — IBUPROFEN 800 MG PO TABS: 800.0000 mg | ORAL_TABLET | Freq: Three times a day (TID) | ORAL | 0 refills | Status: AC | PRN

## 2018-09-14 MED ORDER — CELECOXIB 200 MG PO CAPS
200.0000 mg | ORAL_CAPSULE | ORAL | Status: AC
  Administered 2018-09-14: 200 mg via ORAL

## 2018-09-14 MED ORDER — SODIUM CHLORIDE (PF) 0.9 % IJ SOLN
INTRAMUSCULAR | Status: AC
  Filled 2018-09-14: qty 10

## 2018-09-14 MED ORDER — LIDOCAINE HCL (CARDIAC) PF 100 MG/5ML IV SOSY
PREFILLED_SYRINGE | INTRAVENOUS | Status: DC | PRN
  Administered 2018-09-14: 100 mg via INTRAVENOUS

## 2018-09-14 MED ORDER — DEXAMETHASONE SODIUM PHOSPHATE 4 MG/ML IJ SOLN
INTRAMUSCULAR | Status: DC | PRN
  Administered 2018-09-14: 4 mg via INTRAVENOUS

## 2018-09-14 MED ORDER — DEXTROSE 5 % IV SOLN
3.0000 g | INTRAVENOUS | Status: AC
  Administered 2018-09-14: 2 g via INTRAVENOUS

## 2018-09-14 MED ORDER — MIDAZOLAM HCL 2 MG/2ML IJ SOLN
INTRAMUSCULAR | Status: AC
  Filled 2018-09-14: qty 2

## 2018-09-14 MED ORDER — FENTANYL CITRATE (PF) 100 MCG/2ML IJ SOLN: 25.0000 ug | INTRAMUSCULAR | Status: DC | PRN

## 2018-09-14 MED ORDER — ACETAMINOPHEN 10 MG/ML IV SOLN: 1000.0000 mg | Freq: Once | INTRAVENOUS | Status: DC | PRN

## 2018-09-14 MED ORDER — GABAPENTIN 300 MG PO CAPS
ORAL_CAPSULE | ORAL | Status: AC
  Filled 2018-09-14: qty 1

## 2018-09-14 MED ORDER — SCOPOLAMINE 1 MG/3DAYS TD PT72: 1.0000 | MEDICATED_PATCH | Freq: Once | TRANSDERMAL | Status: DC | PRN

## 2018-09-14 MED ORDER — METHYLENE BLUE 0.5 % INJ SOLN
INTRAVENOUS | Status: AC
  Filled 2018-09-14: qty 10

## 2018-09-14 MED ORDER — CELECOXIB 200 MG PO CAPS
ORAL_CAPSULE | ORAL | Status: AC
  Filled 2018-09-14: qty 1

## 2018-09-14 SURGICAL SUPPLY — 52 items
ADH SKN CLS APL DERMABOND .7 (GAUZE/BANDAGES/DRESSINGS) ×1
APPLIER CLIP 9.375 MED OPEN (MISCELLANEOUS)
APR CLP MED 9.3 20 MLT OPN (MISCELLANEOUS)
BINDER BREAST LRG (GAUZE/BANDAGES/DRESSINGS) IMPLANT
BINDER BREAST MEDIUM (GAUZE/BANDAGES/DRESSINGS) IMPLANT
BINDER BREAST XLRG (GAUZE/BANDAGES/DRESSINGS) ×1 IMPLANT
BINDER BREAST XXLRG (GAUZE/BANDAGES/DRESSINGS) IMPLANT
BLADE SURG 15 STRL LF DISP TIS (BLADE) ×1 IMPLANT
BLADE SURG 15 STRL SS (BLADE) ×2
CANISTER SUC SOCK COL 7IN (MISCELLANEOUS) IMPLANT
CANISTER SUCT 1200ML W/VALVE (MISCELLANEOUS) IMPLANT
CHLORAPREP W/TINT 26ML (MISCELLANEOUS) ×2 IMPLANT
CLIP APPLIE 9.375 MED OPEN (MISCELLANEOUS) IMPLANT
COVER BACK TABLE 60X90IN (DRAPES) ×2 IMPLANT
COVER MAYO STAND STRL (DRAPES) ×2 IMPLANT
COVER PROBE W GEL 5X96 (DRAPES) ×2 IMPLANT
COVER WAND RF STERILE (DRAPES) IMPLANT
DECANTER SPIKE VIAL GLASS SM (MISCELLANEOUS) IMPLANT
DERMABOND ADVANCED (GAUZE/BANDAGES/DRESSINGS) ×1
DERMABOND ADVANCED .7 DNX12 (GAUZE/BANDAGES/DRESSINGS) ×1 IMPLANT
DRAPE LAPAROSCOPIC ABDOMINAL (DRAPES) IMPLANT
DRAPE LAPAROTOMY 100X72 PEDS (DRAPES) ×2 IMPLANT
DRAPE UTILITY XL STRL (DRAPES) ×2 IMPLANT
ELECT COATED BLADE 2.86 ST (ELECTRODE) ×2 IMPLANT
ELECT REM PT RETURN 9FT ADLT (ELECTROSURGICAL) ×2
ELECTRODE REM PT RTRN 9FT ADLT (ELECTROSURGICAL) ×1 IMPLANT
GLOVE BIO SURGEON STRL SZ 6.5 (GLOVE) ×1 IMPLANT
GLOVE BIOGEL PI IND STRL 7.0 (GLOVE) IMPLANT
GLOVE BIOGEL PI IND STRL 8 (GLOVE) ×1 IMPLANT
GLOVE BIOGEL PI INDICATOR 7.0 (GLOVE) ×1
GLOVE BIOGEL PI INDICATOR 8 (GLOVE) ×1
GLOVE ECLIPSE 8.0 STRL XLNG CF (GLOVE) ×2 IMPLANT
GOWN STRL REUS W/ TWL LRG LVL3 (GOWN DISPOSABLE) ×2 IMPLANT
GOWN STRL REUS W/TWL LRG LVL3 (GOWN DISPOSABLE) ×4
HEMOSTAT ARISTA ABSORB 3G PWDR (HEMOSTASIS) IMPLANT
HEMOSTAT SNOW SURGICEL 2X4 (HEMOSTASIS) IMPLANT
KIT MARKER MARGIN INK (KITS) ×2 IMPLANT
NDL HYPO 25X1 1.5 SAFETY (NEEDLE) ×1 IMPLANT
NEEDLE HYPO 25X1 1.5 SAFETY (NEEDLE) ×2 IMPLANT
NS IRRIG 1000ML POUR BTL (IV SOLUTION) ×2 IMPLANT
PACK BASIN DAY SURGERY FS (CUSTOM PROCEDURE TRAY) ×2 IMPLANT
PENCIL BUTTON HOLSTER BLD 10FT (ELECTRODE) ×2 IMPLANT
SLEEVE SCD COMPRESS KNEE MED (MISCELLANEOUS) ×2 IMPLANT
SPONGE LAP 4X18 RFD (DISPOSABLE) ×2 IMPLANT
SUT MNCRL AB 4-0 PS2 18 (SUTURE) ×2 IMPLANT
SUT SILK 2 0 SH (SUTURE) IMPLANT
SUT VICRYL 3-0 CR8 SH (SUTURE) ×2 IMPLANT
SYR CONTROL 10ML LL (SYRINGE) ×2 IMPLANT
TOWEL GREEN STERILE FF (TOWEL DISPOSABLE) ×2 IMPLANT
TRAY FAXITRON CT DISP (TRAY / TRAY PROCEDURE) ×2 IMPLANT
TUBE CONNECTING 20X1/4 (TUBING) IMPLANT
YANKAUER SUCT BULB TIP NO VENT (SUCTIONS) IMPLANT

## 2018-09-14 NOTE — Transfer of Care (Signed)
Immediate Anesthesia Transfer of Care Note  Patient: Tanya Harrison  Procedure(s) Performed: RIGHT BREAST LUMPECTOMY WITH RADIOACTIVE SEED LOCALIZATION (Right Breast)  Patient Location: PACU  Anesthesia Type:General  Level of Consciousness: sedated  Airway & Oxygen Therapy: Patient Spontanous Breathing and Patient connected to face mask oxygen  Post-op Assessment: Report given to RN and Post -op Vital signs reviewed and stable  Post vital signs: Reviewed and stable  Last Vitals:  Vitals Value Taken Time  BP 127/83 09/14/2018  8:19 AM  Temp    Pulse 94 09/14/2018  8:22 AM  Resp 12 09/14/2018  8:22 AM  SpO2 100 % 09/14/2018  8:22 AM  Vitals shown include unvalidated device data.  Last Pain:  Vitals:   09/14/18 0653  TempSrc: Oral  PainSc: 0-No pain      Patients Stated Pain Goal: 0 (91/98/02 2179)  Complications: No apparent anesthesia complications

## 2018-09-14 NOTE — Interval H&P Note (Signed)
History and Physical Interval Note:  09/14/2018 7:19 AM  Tanya Harrison [name]  has presented today for surgery, with the diagnosis of RIGHT BREAST MASS  The various methods of treatment have been discussed with the patient and family. After consideration of risks, benefits and other options for treatment, the patient has consented to  Procedure(s): RIGHT BREAST LUMPECTOMY WITH RADIOACTIVE SEED LOCALIZATION (Right) as a surgical intervention .  The patient's history has been reviewed, patient examined, no change in status, stable for surgery.  I have reviewed the patient's chart and labs.  Questions were answered to the patient's satisfaction.     [address]

## 2018-09-14 NOTE — H&P (Signed)
Tanya Harrison  Location: [facility] Patient #: [id number] DOB: [birth year] Married / Language: English / Race: Undefined Female  History of Present Illness  Patient words: Patient sent at the request of Dr. Corinna Harrison after undergoing workup for right breast pain 5 months. The patient noticed some right breast discomfort which prompted a mammogram and subsequent ultrasound biopsy. There is an area in the right breast upper quadrant with a vague density only seen on the craniocaudal views. Core biopsy was done which showed a complex sclerosing lesion. The patient denies any history of nipple discharge or breast mass.                      Diagnosis Breast, right, needle core biopsy, upper inner quadrant - COMPLEX SCLEROSING LESION. SEE NOTE. - NEGATIVE FOR CARCINOMA.   Patient was called back for possible distortion in the medial right breast on the CC view only.  EXAM: DIGITAL DIAGNOSTIC RIGHT MAMMOGRAM WITH CAD AND TOMO  ULTRASOUND RIGHT BREAST  COMPARISON: Previous exam(s).  ACR Breast Density Category b: There are scattered areas of fibroglandular density.  FINDINGS: There is continued possible distortion in the medial right breast on the CC view. This is thought to be in the upper inner quadrant based on 3D imaging.  Mammographic images were processed with CAD.  On physical exam, no suspicious lumps are identified.  Targeted ultrasound is performed, showing no sonographic correlate to the possible right breast distortion.  IMPRESSION: Possible distortion in the medial right breast only seen on the CC view.  RECOMMENDATION: Recommend attempted stereotactic biopsy of the possible distortion in the medial right breast.  I have discussed the findings and recommendations with the patient. Results were also provided in writing at the conclusion of the visit. If applicable, a reminder letter will be  sent to the patient regarding the next appointment.  BI-RADS CATEGORY 4: Suspicious.   Electronically Signed By: Tanya Harrison III M.D On: 07/03/2018 15:30.  The patient is a 48 year old female.   Past Surgical History Tanya Harrison, CMA; 07/24/2018 10:20 AM) Breast Biopsy Right. Cesarean Section - Multiple  Diagnostic Studies History ( Colonoscopy never Mammogram 1-3 years ago  Allergies  No Known Allergies [ No Known Drug Allergies [ Allergies Reconciled  Medication HistoryNo Current Medications Medications Reconciled  Social History  Alcohol use Occasional alcohol use. No caffeine use No drug use Tobacco use Never smoker.  Family History Cerebrovascular Accident Mother. Colon Cancer Brother. Diabetes Mellitus Mother. Heart disease in female family member before age 13 Hypertension Mother.  Pregnancy / Birth History ( Age at menarche 50 years.  Other Problems  Lump In Breast     Review of Systems  General Not Present- Appetite Loss, Chills, Fatigue, Fever, Night Sweats, Weight Gain and Weight Loss. Skin Not Present- Change in Wart/Mole, Dryness, Hives, Jaundice, New Lesions, Non-Healing Wounds, Rash and Ulcer. HEENT Present- Wears glasses/contact lenses. Not Present- Earache, Hearing Loss, Hoarseness, Nose Bleed, Oral Ulcers, Ringing in the Ears, Seasonal Allergies, Sinus Pain, Sore Throat, Visual Disturbances and Yellow Eyes. Respiratory Not Present- Bloody sputum, Chronic Cough, Difficulty Breathing, Snoring and Wheezing. Musculoskeletal Not Present- Back Pain, Joint Pain, Joint Stiffness, Muscle Pain, Muscle Weakness and Swelling of Extremities. All other systems negative  Vitals ( 07/24/2018 10:22 AM Weight: 184.38 lb Height: 60in Body Surface Area: 1.8 m Body Mass Index: 36.01 kg/m  Temp.: 69F(Temporal)  Pulse: 99 (Regular)  BP: 148/98 (Sitting, Right Arm,  Standard)  Physical Exam   General Mental Status-Alert. General Appearance-Consistent with stated age. Hydration-Well hydrated. Voice-Normal.  Eye Eyeball - Bilateral-Extraocular movements intact. Sclera/Conjunctiva - Bilateral-No scleral icterus.  Chest and Lung Exam Chest and lung exam reveals -quiet, even and easy respiratory effort with no use of accessory muscles and on auscultation, normal breath sounds, no adventitious sounds and normal vocal resonance. Inspection Chest Wall - Normal. Back - normal.  Breast Note: Bruising right breast upper central portion. No masses in either breast.  Cardiovascular Cardiovascular examination reveals -normal heart sounds, regular rate and rhythm with no murmurs and normal pedal pulses bilaterally.  Neurologic Neurologic evaluation reveals -alert and oriented x 3 with no impairment of recent or remote memory. Mental Status-Normal.  Lymphatic Head & Neck  General Head & Neck Lymphatics: Bilateral - Description - Normal. Axillary  General Axillary Region: Bilateral - Description - Normal. Tenderness - Non Tender.    Assessment & Plan   BREAST MASS, RIGHT (N63.10) Impression: CSL  Discussed observation versus lumpectomy. Pros and cons of pathophysiology discussed. She has opted for right breast seed localized lumpectomy over observation. Risk of lumpectomy include bleeding, infection, seroma, more surgery, use of seed/wire, wound care, cosmetic deformity and the need for other treatments, death , blood clots, death. Pt agrees to proceed.  Current Plans You are being scheduled for surgery- Our schedulers will call you.  You should hear from our office's scheduling department within 5 working days about the location, date, and time of surgery. We try to make accommodations for patient's preferences in scheduling surgery, but sometimes the OR schedule or the surgeon's schedule  prevents [place] from making those accommodations.  If you have not heard from our office [phone]) in 5 working days, call the office and ask for your surgeon's nurse.  If you have other questions about your diagnosis, plan, or surgery, call the office and ask for your surgeon's nurse.  Pt Education - [facility] Breast Biopsy HCI: discussed with patient and provided information.

## 2018-09-14 NOTE — Anesthesia Postprocedure Evaluation (Signed)
Anesthesia Post Note  Patient: Tanya Harrison  Procedure(s) Performed: RIGHT BREAST LUMPECTOMY WITH RADIOACTIVE SEED LOCALIZATION (Right Breast)     Patient location during evaluation: PACU Anesthesia Type: General Level of consciousness: awake and alert Pain management: pain level controlled Vital Signs Assessment: post-procedure vital signs reviewed and stable Respiratory status: spontaneous breathing, nonlabored ventilation and respiratory function stable Cardiovascular status: blood pressure returned to baseline and stable Postop Assessment: no apparent nausea or vomiting Anesthetic complications: no    Last Vitals:  Vitals:   09/14/18 0830 09/14/18 0845  BP: 119/82 (!) 128/91  Pulse: 88 84  Resp: 14 14  Temp:    SpO2: 100% 98%    Last Pain:  Vitals:   09/14/18 0820  TempSrc:   PainSc: [place] E Payden Docter

## 2018-09-14 NOTE — Op Note (Signed)
Preoperative diagnosis: Right breast radial scar  Postoperative diagnosis: Same  Procedure: Right breast seed localized lumpectomy  Surgeon: Erroll Luna, MD  Anesthesia: LMA with 0.25% Sensorcaine local with epinephrine  EBL: 10 cc  Specimen: Right breast tissue with seed and clip verified by Faxitron  Drains: None  IV fluids: Per anesthesia record  Indications for procedure: The patient presents for right breast lumpectomy secondary to a mammographic abnormality of a radial scar verified by core biopsy.  We discussed potential risk of upgrade to a more malignant lesion to being anywhere from 0 to 20%.  She opted for lumpectomy after this discussion.The procedure has been discussed with the patient. Alternatives to surgery have been discussed with the patient.  Risks of surgery include bleeding,  Infection,  Seroma formation, death,  and the need for further surgery.   The patient understands and wishes to proceed.  Description of procedure: The patient was met in the holding area and questions were answered.  The right breast was marked as the correct side and the neoprobe was used to verify seed location.  Russians were answered.  She was taken back to the operating room.  She is placed supine upon the operating room table.  After induction of general anesthesia, right breast was prepped and draped in sterile fashion she received appropriate preoperative antibiotics.  Timeout was done.  Neoprobe was used to identify location of seed just medial to the nipple areolar complex.  Films were available for review as well.  Curvilinear incision was made along the medial border of the nipple areolar complex.  Dissection was carried down all tissue around the seed and clip were excised with a grossly negative margin with the assistance of the neoprobe.  Faxitron revealed both seed and clip to be in specimen.  The cavity was irrigated.  Local anesthetic was infiltrated.  It was then closed after  assuring hemostasis with 3-0 Vicryl and 4-0 Monocryl.  Dermabond was applied.  All final counts were found to be correct.  The patient was awoke extubated taken to recovery in satisfactory condition.

## 2018-09-14 NOTE — Anesthesia Procedure Notes (Signed)
Procedure Name: LMA Insertion Date/Time: 09/14/2018 7:34 AM Performed by: Maryella Shivers, CRNA Pre-anesthesia Checklist: Patient identified, Emergency Drugs available, Suction available and Patient being monitored Patient Re-evaluated:Patient Re-evaluated prior to induction Oxygen Delivery Method: Circle system utilized Preoxygenation: Pre-oxygenation with 100% oxygen Induction Type: IV induction Ventilation: Mask ventilation without difficulty LMA: LMA inserted LMA Size: 4.0 Number of attempts: 1 Airway Equipment and Method: Bite block Placement Confirmation: positive ETCO2 Tube secured with: Tape Dental Injury: Teeth and Oropharynx as per pre-operative assessment

## 2018-09-14 NOTE — Discharge Instructions (Signed)
Tylenol 1,000mg  given today at 0700 No ibuprofen before 1:00pm    [place] Office Phone Number [phone]  BREAST BIOPSY/ PARTIAL MASTECTOMY: POST OP INSTRUCTIONS  Always review your discharge instruction sheet given to you by the facility where your surgery was performed.  IF YOU HAVE DISABILITY OR FAMILY LEAVE FORMS, YOU MUST BRING THEM TO THE OFFICE FOR PROCESSING.  DO NOT GIVE THEM TO YOUR DOCTOR.  1. A prescription for pain medication may be given to you upon discharge.  Take your pain medication as prescribed, if needed.  If narcotic pain medicine is not needed, then you may take acetaminophen (Tylenol) or ibuprofen (Advil) as needed. 2. Take your usually prescribed medications unless otherwise directed 3. If you need a refill on your pain medication, please contact your pharmacy.  They will contact our office to request authorization.  Prescriptions will not be filled after 5pm or on week-ends. 4. You should eat very light the first 24 hours after surgery, such as soup, crackers, pudding, etc.  Resume your normal diet the day after surgery. 5. Most patients will experience some swelling and bruising in the breast.  Ice packs and a good support bra will help.  Swelling and bruising can take several days to resolve.  6. It is common to experience some constipation if taking pain medication after surgery.  Increasing fluid intake and taking a stool softener will usually help or prevent this problem from occurring.  A mild laxative (Milk of Magnesia or Miralax) should be taken according to package directions if there are no bowel movements after 48 hours. 7. Unless discharge instructions indicate otherwise, you may remove your bandages 24-48 hours after surgery, and you may shower at that time.  You may have steri-strips (small skin tapes) in place directly over the incision.  These strips should be left on the skin for 7-10 days.  If your surgeon used skin glue on the  incision, you may shower in 24 hours.  The glue will flake off over the next 2-3 weeks.  Any sutures or staples will be removed at the office during your follow-up visit. 8. ACTIVITIES:  You may resume regular daily activities (gradually increasing) beginning the next day.  Wearing a good support bra or sports bra minimizes pain and swelling.  You may have sexual intercourse when it is comfortable. a. You may drive when you no longer are taking prescription pain medication, you can comfortably wear a seatbelt, and you can safely maneuver your car and apply brakes. b. RETURN TO WORK:  ______________________________________________________________________________________ 9. You should see your doctor in the office for a follow-up appointment approximately two weeks after your surgery.  Your doctors nurse will typically make your follow-up appointment when she calls you with your pathology report.  Expect your pathology report 2-3 business days after your surgery.  You may call to check if you do not hear from [place] after three days. 10. OTHER INSTRUCTIONS: _______________________________________________________________________________________________ _____________________________________________________________________________________________________________________________________ _____________________________________________________________________________________________________________________________________ _____________________________________________________________________________________________________________________________________  WHEN TO CALL YOUR DOCTOR: 1. Fever over 101.0 2. Nausea and/or vomiting. 3. Extreme swelling or bruising. 4. Continued bleeding from incision. 5. Increased pain, redness, or drainage from the incision.  The clinic staff is available to answer your questions during regular business hours.  Please dont hesitate to call and ask to speak to one of the nurses for  clinical concerns.  If you have a medical emergency, go to the nearest emergency room or call 911.  A surgeon from [facility] is always  on call at the hospital.  For further questions, please visit [email or web address]     Post Anesthesia Home Care Instructions  Activity: Get plenty of rest for the remainder of the day. A responsible individual must stay with you for 24 hours following the procedure.  For the next 24 hours, DO NOT: -Drive a car -Paediatric nurse -Drink alcoholic beverages -Take any medication unless instructed by your physician -Make any legal decisions or sign important papers.  Meals: Start with liquid foods such as gelatin or soup. Progress to regular foods as tolerated. Avoid greasy, spicy, heavy foods. If nausea and/or vomiting occur, drink only clear liquids until the nausea and/or vomiting subsides. Call your physician if vomiting continues.  Special Instructions/Symptoms: Your throat may feel dry or sore from the anesthesia or the breathing tube placed in your throat during surgery. If this causes discomfort, gargle with warm salt water. The discomfort should disappear within 24 hours.  If you had a scopolamine patch placed behind your ear for the management of post- operative nausea and/or vomiting:  1. The medication in the patch is effective for 72 hours, after which it should be removed.  Wrap patch in a tissue and discard in the trash. Wash hands thoroughly with soap and water. 2. You may remove the patch earlier than 72 hours if you experience unpleasant side effects which may include dry mouth, dizziness or visual disturbances. 3. Avoid touching the patch. Wash your hands with soap and water after contact with the patch.

## 2018-09-15 ENCOUNTER — Encounter (HOSPITAL_BASED_OUTPATIENT_CLINIC_OR_DEPARTMENT_OTHER): Payer: Self-pay | Admitting: Surgery

## 2019-09-21 ENCOUNTER — Other Ambulatory Visit: Payer: Self-pay | Admitting: Internal Medicine

## 2019-09-21 DIAGNOSIS — Z1231 Encounter for screening mammogram for malignant neoplasm of breast: Secondary | ICD-10-CM

## 2019-10-05 ENCOUNTER — Ambulatory Visit: Payer: Self-pay | Attending: Internal Medicine

## 2019-10-05 DIAGNOSIS — Z23 Encounter for immunization: Secondary | ICD-10-CM

## 2019-10-05 NOTE — Progress Notes (Signed)
   Covid-19 Vaccination Clinic  Name:  Tanya Harrison    MRN: [id number] DOB: 1971/05/31  10/05/2019  [name] was observed post Covid-19 immunization for 15 minutes without incident. She was provided with Vaccine Information Sheet and instruction to access the V-Safe system.   Ms. Timian was instructed to call 911 with any severe reactions post vaccine: Marland Kitchen Difficulty breathing  . Swelling of face and throat  . A fast heartbeat  . A bad rash all over body  . Dizziness and weakness   Immunizations Administered    Name Date Dose VIS Date Route   Pfizer COVID-19 Vaccine 10/05/2019  3:47 PM 0.3 mL 06/22/2019 Intramuscular   Manufacturer: [place]   Lot: [id number]   [place]: [id number]

## 2019-10-15 ENCOUNTER — Ambulatory Visit: Payer: 59

## 2019-10-26 ENCOUNTER — Ambulatory Visit: Payer: Self-pay | Attending: Internal Medicine

## 2019-10-26 DIAGNOSIS — Z23 Encounter for immunization: Secondary | ICD-10-CM

## 2019-10-26 NOTE — Progress Notes (Signed)
   Covid-19 Vaccination Clinic  Name:  Tanya Harrison    MRN: [id number] DOB: 08/18/1970  10/26/2019  [name] was observed post Covid-19 immunization for 15 minutes without incident. She was provided with Vaccine Information Sheet and instruction to access the V-Safe system.   [name] was instructed to call 911 with any severe reactions post vaccine: Marland Kitchen Difficulty breathing  . Swelling of face and throat  . A fast heartbeat  . A bad rash all over body  . Dizziness and weakness   Immunizations Administered    Name Date Dose VIS Date Route   Pfizer COVID-19 Vaccine 10/26/2019  1:08 PM 0.3 mL 06/22/2019 Intramuscular   Manufacturer: [place]   Lot: [id number]   [place]: [id number]

## 2019-10-30 ENCOUNTER — Ambulatory Visit: Payer: Self-pay

## 2019-12-14 ENCOUNTER — Ambulatory Visit: Payer: Self-pay

## 2019-12-21 ENCOUNTER — Other Ambulatory Visit: Payer: Self-pay

## 2019-12-21 ENCOUNTER — Ambulatory Visit: Payer: Self-pay

## 2019-12-21 ENCOUNTER — Ambulatory Visit
Admission: RE | Admit: 2019-12-21 | Discharge: 2019-12-21 | Disposition: A | Discharge status: Home / Self Care | Payer: No Typology Code available for payment source | Source: Ambulatory Visit | Attending: Internal Medicine | Admitting: Internal Medicine

## 2019-12-21 DIAGNOSIS — Z1231 Encounter for screening mammogram for malignant neoplasm of breast: Secondary | ICD-10-CM

## 2020-05-02 IMAGING — MG MM CLIP PLACEMENT
6 series · 6 of 14 positions shown · non-contrast
Comparison: Previous exam(s).

CLINICAL DATA: Post biopsy mammogram of the right breast for clip
placement.

EXAM:
DIAGNOSTIC RIGHT MAMMOGRAM POST ULTRASOUND BIOPSY

[R CC]
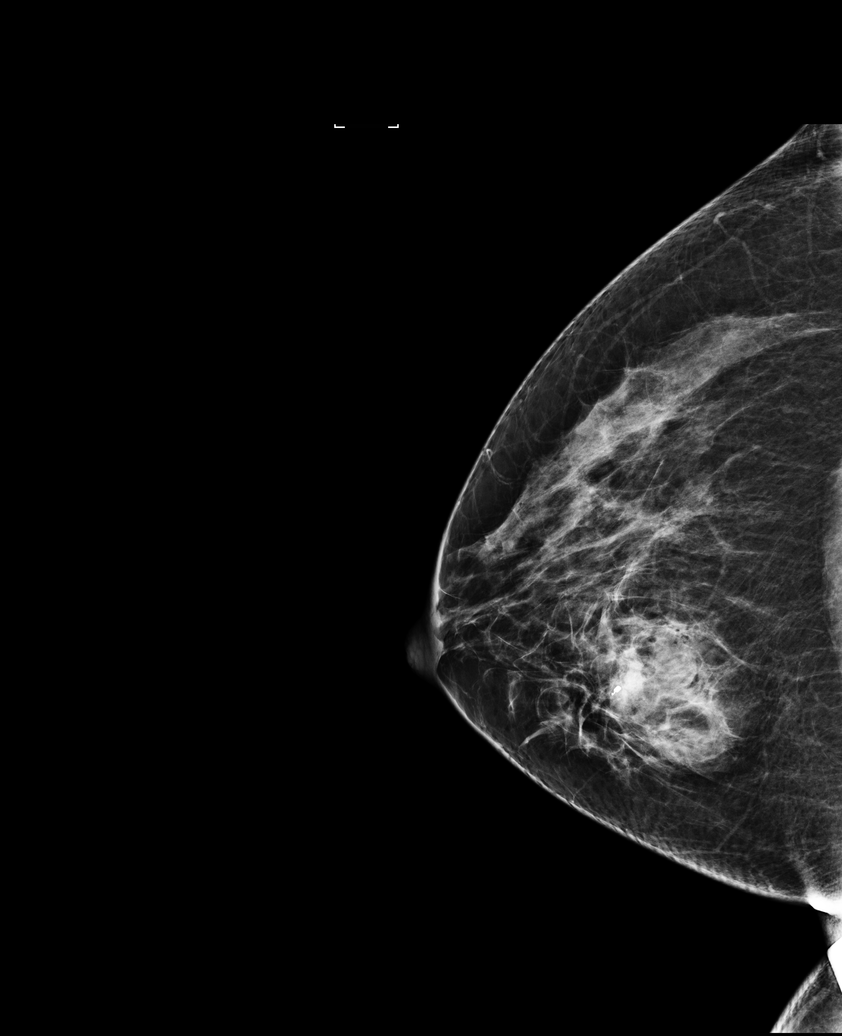

[R ML]
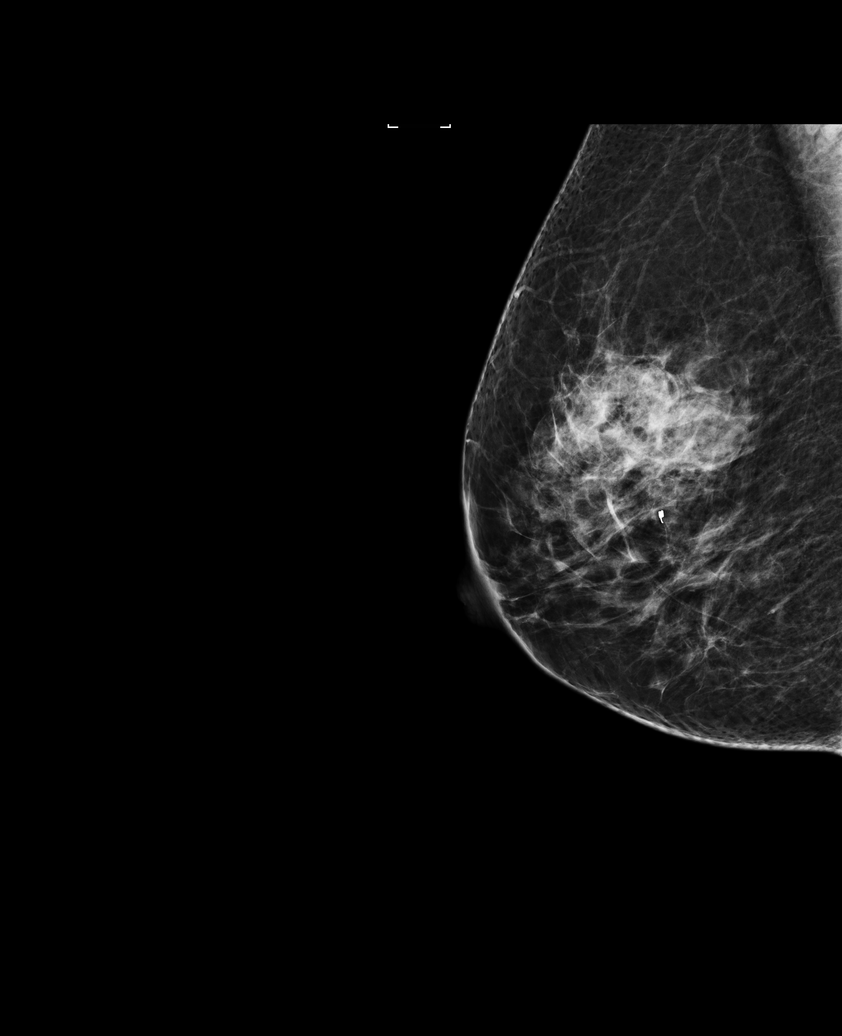

[R CC synth-2D]
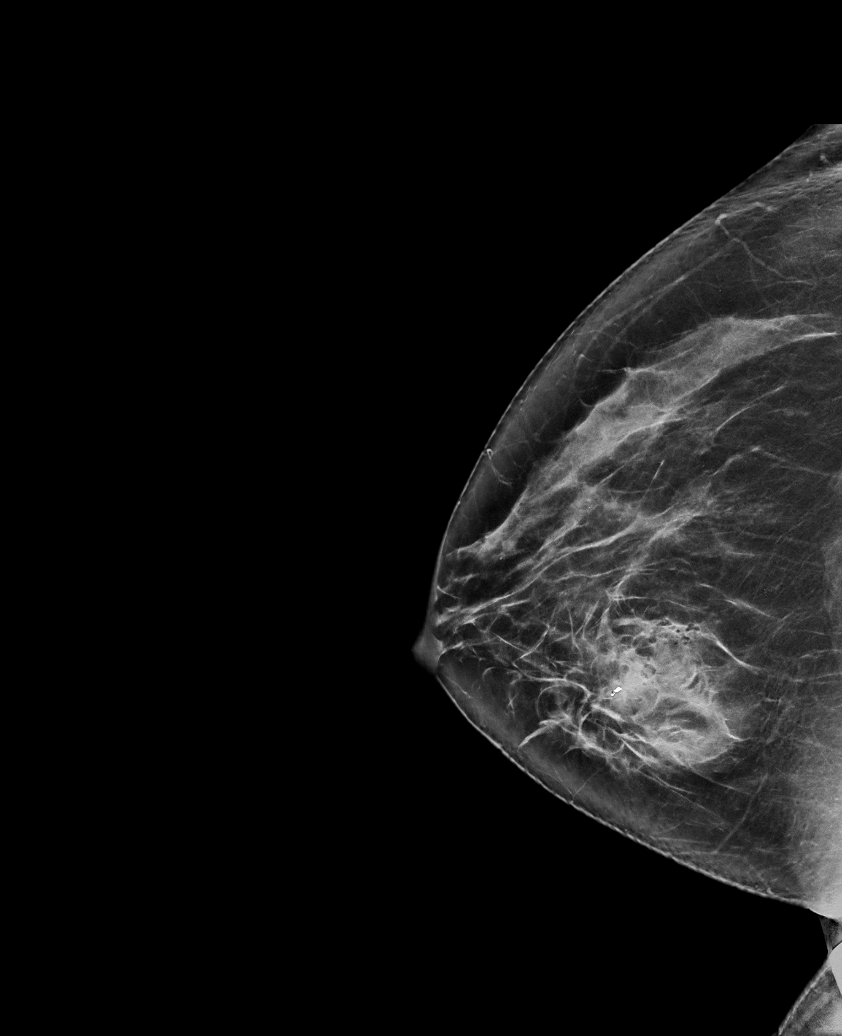

[R ML synth-2D]
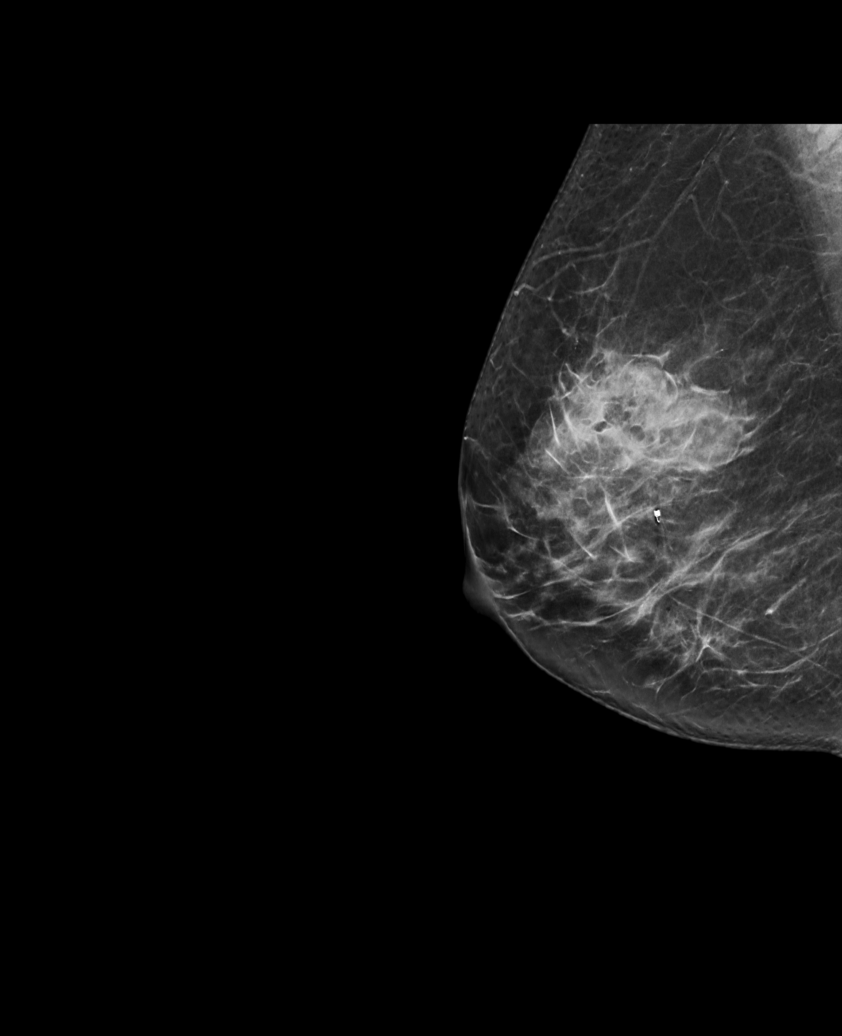

[R ML tomo · tomo slice 39/77.0]
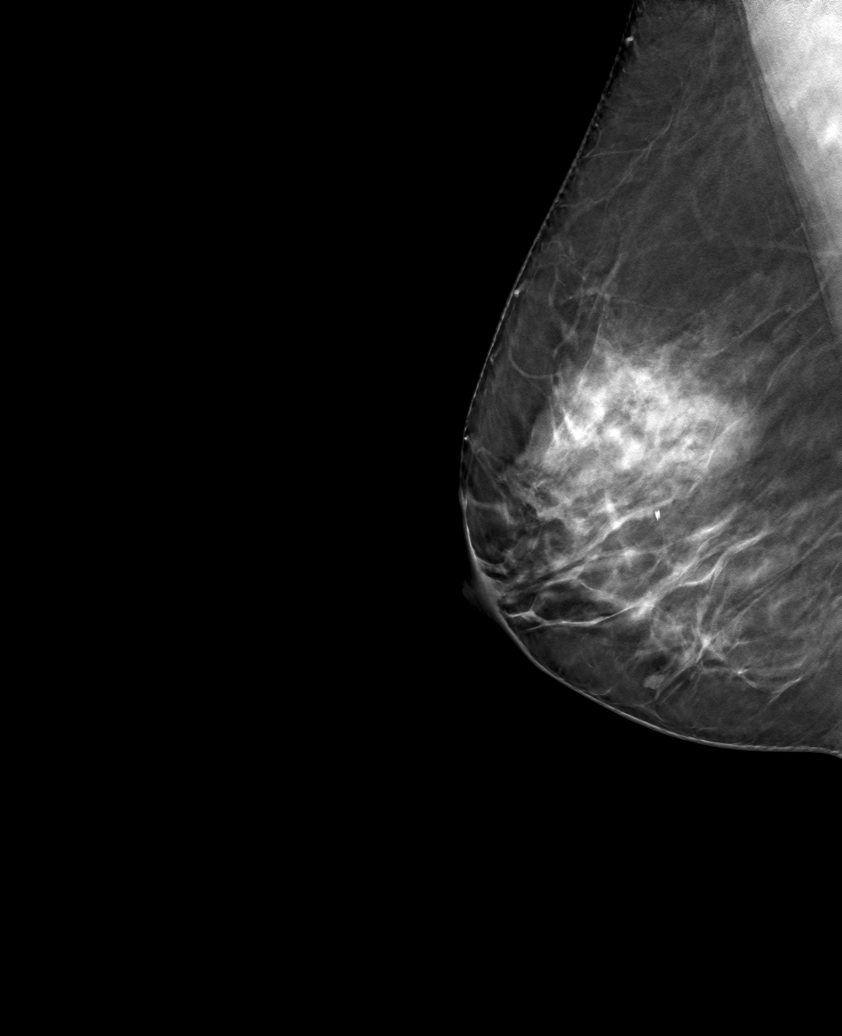

[R CC tomo · tomo slice 38/75.0]
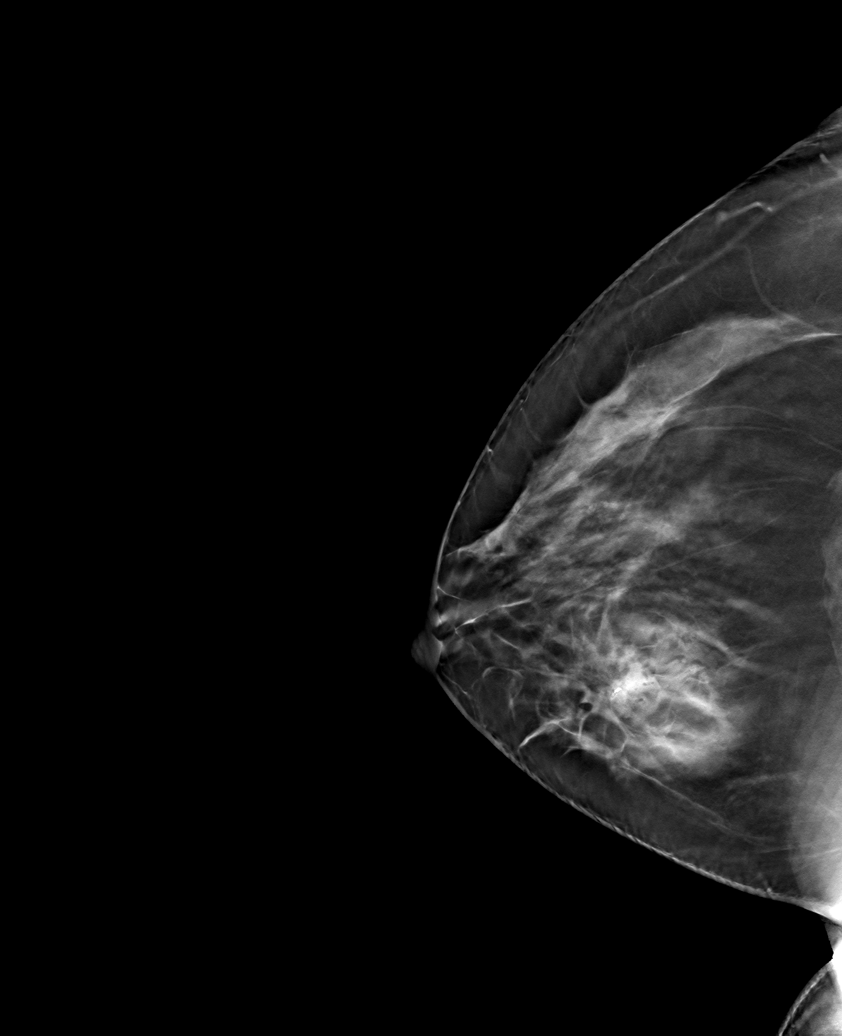

[6 of 14 positions shown; findings below may reference images not displayed]

FINDINGS: Mammographic images were obtained following stereotactic guided
biopsy of distortion in the upper inner right breast. The coil
shaped biopsy marking clip is well positioned at the site of
distortion in the upper inner right breast.
IMPRESSION: Appropriate positioning of the coil shaped biopsy marking clip in
the upper inner right breast.

Final Assessment: Post Procedure Mammograms for Marker Placement

## 2020-08-04 ENCOUNTER — Other Ambulatory Visit: Payer: No Typology Code available for payment source

## 2020-08-04 ENCOUNTER — Other Ambulatory Visit: Payer: Self-pay

## 2020-08-04 DIAGNOSIS — Z20822 Contact with and (suspected) exposure to covid-19: Secondary | ICD-10-CM

## 2020-08-05 LAB — NOVEL CORONAVIRUS, NAA: SARS-CoV-2, NAA: NOT DETECTED

## 2020-08-05 LAB — SARS-COV-2, NAA 2 DAY TAT

## 2020-12-30 LAB — CBC: RBC: 4.66 (ref 3.87–5.11)

## 2020-12-30 LAB — TSH: TSH: 0.98 (ref 0.41–5.90)

## 2020-12-30 LAB — BASIC METABOLIC PANEL
BUN: 16 (ref 4–21)
Chloride: 100 (ref 99–108)
Creatinine: 0.6 (ref 0.5–1.1)
Glucose: 78
Potassium: 3.9 (ref 3.4–5.3)
Sodium: 140 (ref 137–147)

## 2020-12-30 LAB — CBC AND DIFFERENTIAL
HCT: 37 (ref 36–46)
Hemoglobin: 12.2 (ref 12.0–16.0)
Platelets: 221 (ref 150–399)
WBC: 6.9

## 2020-12-30 LAB — HEPATIC FUNCTION PANEL
ALT: 11 (ref 7–35)
AST: 9 — AB (ref 13–35)
Alkaline Phosphatase: 82 (ref 25–125)
Bilirubin, Direct: 0.1 (ref 0.01–0.4)
Bilirubin, Total: 0.2

## 2020-12-30 LAB — COMPREHENSIVE METABOLIC PANEL
Albumin: 4.2 (ref 3.5–5.0)
Calcium: 9.5 (ref 8.7–10.7)

## 2020-12-31 ENCOUNTER — Encounter: Payer: Self-pay | Admitting: Internal Medicine

## 2021-01-01 ENCOUNTER — Encounter: Payer: Self-pay | Admitting: Nurse Practitioner

## 2021-02-27 LAB — HM COLONOSCOPY

## 2021-03-02 ENCOUNTER — Other Ambulatory Visit: Payer: Self-pay | Admitting: Obstetrics and Gynecology

## 2021-03-02 DIAGNOSIS — Z1231 Encounter for screening mammogram for malignant neoplasm of breast: Secondary | ICD-10-CM

## 2021-03-06 ENCOUNTER — Encounter: Payer: Self-pay | Admitting: Internal Medicine

## 2021-03-31 ENCOUNTER — Ambulatory Visit
Admission: RE | Admit: 2021-03-31 | Discharge: 2021-03-31 | Disposition: A | Discharge status: Home / Self Care | Payer: No Typology Code available for payment source | Source: Ambulatory Visit | Attending: Obstetrics and Gynecology | Admitting: Obstetrics and Gynecology

## 2021-03-31 ENCOUNTER — Other Ambulatory Visit: Payer: Self-pay

## 2021-03-31 DIAGNOSIS — Z1231 Encounter for screening mammogram for malignant neoplasm of breast: Secondary | ICD-10-CM

## 2021-07-21 ENCOUNTER — Telehealth: Payer: Self-pay

## 2021-07-21 NOTE — Telephone Encounter (Signed)
Mrs. Tanya Harrison called stating she would like appt she has not been seen since 2018-2019 which makes the patient a new patient. I explained to her the soonest I will be able to make a new patient appt is March and she states her bp has been elevated with no history of high bp. She states that the dates would not work for her I did apologize and explain that it had been more than 3 years since she has been seen and it would be a new patient appointment.

## 2022-05-18 ENCOUNTER — Other Ambulatory Visit: Payer: Self-pay | Admitting: Obstetrics and Gynecology

## 2022-05-18 DIAGNOSIS — Z1231 Encounter for screening mammogram for malignant neoplasm of breast: Secondary | ICD-10-CM

## 2022-07-23 ENCOUNTER — Ambulatory Visit
Admission: RE | Admit: 2022-07-23 | Discharge: 2022-07-23 | Disposition: A | Discharge status: Home / Self Care | Payer: No Typology Code available for payment source | Source: Ambulatory Visit | Attending: Obstetrics and Gynecology | Admitting: Obstetrics and Gynecology

## 2022-07-23 DIAGNOSIS — Z1231 Encounter for screening mammogram for malignant neoplasm of breast: Secondary | ICD-10-CM

## 2023-07-28 ENCOUNTER — Other Ambulatory Visit (HOSPITAL_BASED_OUTPATIENT_CLINIC_OR_DEPARTMENT_OTHER): Payer: Self-pay | Admitting: Physician Assistant

## 2023-07-28 DIAGNOSIS — E7800 Pure hypercholesterolemia, unspecified: Secondary | ICD-10-CM

## 2023-08-05 ENCOUNTER — Ambulatory Visit (HOSPITAL_COMMUNITY)
Admission: RE | Admit: 2023-08-05 | Discharge: 2023-08-05 | Disposition: A | Discharge status: Home / Self Care | Payer: Self-pay | Source: Ambulatory Visit | Attending: Physician Assistant | Admitting: Physician Assistant

## 2023-08-05 ENCOUNTER — Encounter (HOSPITAL_COMMUNITY): Payer: Self-pay

## 2023-08-05 DIAGNOSIS — E7800 Pure hypercholesterolemia, unspecified: Secondary | ICD-10-CM | POA: Insufficient documentation

## 2023-08-11 ENCOUNTER — Other Ambulatory Visit (HOSPITAL_COMMUNITY): Payer: No Typology Code available for payment source

## 2023-08-26 ENCOUNTER — Other Ambulatory Visit: Payer: Self-pay

## 2023-08-26 ENCOUNTER — Ambulatory Visit: Payer: No Typology Code available for payment source | Attending: Physician Assistant | Admitting: Physical Therapy

## 2023-08-26 ENCOUNTER — Encounter: Payer: Self-pay | Admitting: Physical Therapy

## 2023-08-26 DIAGNOSIS — M6281 Muscle weakness (generalized): Secondary | ICD-10-CM | POA: Insufficient documentation

## 2023-08-26 DIAGNOSIS — M5459 Other low back pain: Secondary | ICD-10-CM | POA: Insufficient documentation

## 2023-08-26 DIAGNOSIS — M25551 Pain in right hip: Secondary | ICD-10-CM | POA: Insufficient documentation

## 2023-08-26 DIAGNOSIS — R252 Cramp and spasm: Secondary | ICD-10-CM | POA: Diagnosis present

## 2023-08-26 NOTE — Therapy (Signed)
OUTPATIENT PHYSICAL THERAPY THORACOLUMBAR EVALUATION   Patient Name: Tanya Harrison MRN: [id number] DOB:May 14, 1971, 53 y.o., female Today's Date: 08/26/2023  END OF SESSION:  PT End of Session - 08/26/23 0758     Visit Number 1    Date for PT Re-Evaluation 10/21/23    Authorization Type [employer] - [place] Preferred, no auth needed    Authorization - Number of Visits [id number]    PT Start Time 0759    PT Stop Time 0847    PT Time Calculation (min) 48 min    Activity Tolerance Patient tolerated treatment well    Behavior During Therapy WFL for tasks assessed/performed             Past Medical History:  Diagnosis Date   Anemia    Past Surgical History:  Procedure Laterality Date   ABDOMINAL HYSTERECTOMY     BREAST EXCISIONAL BIOPSY Right 09/14/2018   CSL   BREAST LUMPECTOMY WITH RADIOACTIVE SEED LOCALIZATION Right 09/14/2018   Procedure: RIGHT BREAST LUMPECTOMY WITH RADIOACTIVE SEED LOCALIZATION;  Surgeon: Harriette Bouillon, MD;  Location: [facility];  Service: General;  Laterality: Right;   CESAREAN SECTION     twice   CHOLECYSTECTOMY     LAPAROSCOPIC ASSISTED VAGINAL HYSTERECTOMY N/A 06/11/2013   Procedure: LAPAROSCOPIC ASSISTED VAGINAL HYSTERECTOMY;  Surgeon: Turner Daniels, MD;  Location: [facility];  Service: Gynecology;  Laterality: N/A;   Patient Active Problem List   Diagnosis Date Noted   S/P laparoscopic assisted vaginal hysterectomy (LAVH) 06/11/2013    PCP: Dorothyann Peng, MD  REFERRING PROVIDER: Delma Officer, PA  REFERRING DIAG: [phone] (ICD-10-CM) - Right hip pain 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  I have had LBP and Rt hip pain on and off for 2 years but more frequent lately.  Was doing mixed fit classes and water classes but it would aggravate my pain.  I still do water fit 2x/week and take walks on other days.  I ramped up my  activity recently doing too much too fast and this past Monday after water fit my Rt hip started bothering me more.  I feel tight bands in my hip that wrap from front/groin to back into my buttock.  I tried to stretch and couldn't go up/down steps.  Stretching and ibuprofen have helped and my pain has gone from 10/10 to 2/10 now.  Pain is worse with laying on my back.  Sitting is ok.  It will grab when I shift my weight.  PERTINENT HISTORY:  2x c-sections Lumpectomy of Rt breast 2014 - benign  PAIN:  PAIN:  Are you having pain? Yes NPRS scale: 2/10 now, but on Monday this week it was a 10/10 Pain location: Rt buttock primarily, groin pain, anterior hip pain, low back  Pain orientation: Right  PAIN TYPE: aching, dull, sharp, and tight Pain description: intermittent  Aggravating factors: laying on my back, exercise, bending, prolonged standing Relieving factors: ibuprofen, stretching maybe   PRECAUTIONS: None  RED FLAGS: None   WEIGHT BEARING RESTRICTIONS: No  FALLS:  Has patient fallen in last 6 months? No  LIVING ENVIRONMENT: Lives with: lives with their family Lives in: House/apartment Stairs: Yes: Internal: 9 steps; on right going up and External: 3 steps; none Has following equipment at home: None  OCCUPATION: works from home, computer  PLOF: Independent  PATIENT GOALS: continue to exercise 4-6 days a week with water classes and walking up to 2 miles without pain, be able to bend without back  pain  NEXT MD VISIT: as needed for the pain  OBJECTIVE:  Note: Objective measures were completed at Evaluation unless otherwise noted.  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  PATIENT SURVEYS:  Modified Oswestry 21/50   COGNITION: Overall cognitive status: Within functional limits for tasks assessed     SENSATION: WFL  MUSCLE LENGTH: Tight bil HS, quads, hip flexors, Rt adductors, Rt piriformis, Rt gluteals, lumbar paraspinals  POSTURE: increased lumbar lordosis  PALPATION: Tender: Rt adductors, adductor insertion along pubic ramus, iliacus, iliopsoas, glut med, glut max, piriformis, lumbar multifidi Rt L3-L5 Tight: bil thoracic paraspinals Spasm: Rt adductors, piriformis, lumbar multifidi Rt L3-L5  LUMBAR ROM:   AROM eval  Flexion Bends knees, reverses lumbar lordosis to neutral, fingers to ankles, p! Into hip  Extension 5, pain  Right lateral flexion 70% p!  Left lateral flexion 90%  Right rotation 50% p!  Left rotation 50% p!   (Blank rows = not tested)  LOWER EXTREMITY ROM:     Passive  Right eval Left eval  Hip flexion [facility]  Hip extension Limited 50% tight hip flexors Limited 50% tight hip flexors  Hip abduction Limited 30% tight adductors WFL  Hip adduction WNL WNL  Hip internal rotation WNL WNL  Hip external rotation Limited 30% WNL  Knee flexion Limited end range, tight quads Limited end range, tight quads  Knee extension WNL WNL  Ankle dorsiflexion    Ankle plantarflexion    Ankle inversion    Ankle eversion     (Blank rows = not tested)  LOWER EXTREMITY MMT:   Right hip ER 4-/5 with p! Right hip IR 4-/5 with p! Hip extension 4/5 bil Hip flexion 4/5 poor trunk stabilization Knee flexion bil 4/5 Knee extension bil 4/5 Decreased TA activation with position transitions  LUMBAR SPECIAL TESTS:  Single leg stance test: Positive on Rt, lateral trunk lean over Rt LE  FUNCTIONAL TESTS:  5  times sit  to stand: 8.13  GAIT: Distance walked: within clinic Assistive device utilized: None Level of assistance: Complete Independence Comments: no major gait deviations  TREATMENT DATE:  08/26/23:   Initiated HEP and gave handouts Verbally introduced DN - technique, purpose, side effects Educated Pt on evaluation findings including flexibility restrictions, spasm presence, and weakness and POC for how to address                                                                                                                          PATIENT EDUCATION:  Education details: [email or web address] Person educated: Patient Education method: Programmer, multimedia, Facilities manager, and Handouts Education comprehension: verbalized understanding and returned demonstration  HOME EXERCISE PROGRAM: Access Code: [email or web address] URL: [email or web address] Date: 08/26/2023 Prepared by: Loistine Simas Lisl Slingerland  Exercises - Seated Flexion Stretch  - 3 x daily - 7 x weekly - 1 sets - 3 reps - 10-20 hold - Seated Piriformis Stretch  - 3 x daily - 7 x weekly - 1 sets - 3 reps - 10-20 hold - Seated Figure 4 Piriformis Stretch  - 3 x daily - 7 x weekly - 1 sets - 3 reps - 10-20 hold  ASSESSMENT:  CLINICAL IMPRESSION: Patient is a 53 y.o. female who was seen today for physical therapy evaluation and treatment for Rt hip pain (anterior and posterior) and LBP.  She suffered a fall down the stairs onto Rt hip about 2 years ago with intermittent pain since then. Pain worsened 5 days ago after a week of trying to ramp up her activity level with water classes, mixed fit classes and walking.  Her pain was a 10/10 but is now a 2/10 having used ibuprofen and stretches (piriformis).  Pain is worse with laying on her back, bending, and prolonged standing.  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  She presents with significant lumbar lordosis, limited trunk ROM, signif LE flexibility restrictions and core and Rt hip weakness. Her goals are to be able  to walk 2 miles up to 4x/week and continue water fit classes 2x/week.  She also needs to improve bending mechanics and strength for household tasks and ADLs.  She will benefit from skilled PT to address deficits and meet her goals.   OBJECTIVE IMPAIRMENTS: decreased activity tolerance, decreased mobility, decreased ROM, decreased strength, hypomobility, increased fascial restrictions, increased muscle spasms, impaired flexibility, improper body mechanics, postural dysfunction, and pain.   ACTIVITY LIMITATIONS: carrying, lifting, bending, sitting, standing, squatting, bathing, dressing, hygiene/grooming, and locomotion level  PARTICIPATION LIMITATIONS: meal prep, cleaning, laundry, shopping, and community activity  PERSONAL FACTORS: Time since onset of injury/illness/exacerbation are also affecting patient's functional outcome.   REHAB POTENTIAL: Excellent  CLINICAL DECISION MAKING: Evolving/moderate complexity  EVALUATION COMPLEXITY: Moderate   GOALS: Goals reviewed with patient? Yes  SHORT TERM GOALS: Target date: 09/23/23  Pt will be ind with initial HEP Baseline: Goal status: INITIAL  2.  Pt will demo proper body mechanics and control with functional squat. Baseline:  Goal status: INITIAL  3.  Pt will demo improved activation of deep core with overlay of UE/LE movements such as dead bug, bird dog, standing 09/29/23. Baseline:  Goal status: INITIAL    LONG TERM GOALS: Target date: 11/20/23  Pt will be ind with HEP and understand how to safely progress daily activities to reduce exacerbation of pain. Baseline:  Goal status: INITIAL  2.  Pt will be able to walk up to 2 miles 3-4 days a week without exacerbation of pain. Baseline:  Goal status: INITIAL  3.  Pt will be able to participate in water classes 2 days/week without exacerbation of pain Baseline:  Goal status: INITIAL  4.  Pt will demo proper form, ROM WFL, and core control for bending and squatting tasks so she  will be able to perform daily tasks and ADLs without pain. Baseline:  Goal status: INITIAL  5.  Pt will be able to sit for work without exacerbation of pain. Baseline:  Goal status: INITIAL  6.  Pt will improve ODI score to </= 13/50 to demo improved function. Baseline: 21/50 Goal status: INITIAL  PLAN:  PT FREQUENCY: 1-2x/week  PT DURATION: 8 weeks  PLANNED INTERVENTIONS: 97110-Therapeutic exercises, 97530- Therapeutic activity, [id number]- Neuromuscular re-education, 97535- Self Care, 40981- Manual therapy, 97014- Electrical stimulation (unattended), [id number]- Electrical stimulation (manual), [id number]- Traction (mechanical), [id number]- Ionotophoresis 4mg /ml Dexamethasone, Patient/Family education, Taping, Dry Needling, Joint mobilization, Spinal mobilization, Cryotherapy, and Moist heat.  PLAN FOR NEXT SESSION: DN Rt piriformis, adductors, gluteals, lumbar multifidi.  Review HEP.  Over next few visits, intro deep core in sitting and SL (has more pain in supine) and progress LE flexibility (add quads, HS, hip flexors, trunk stretches)   Burlie Cajamarca, PT 08/26/23 12:33 PM

## 2023-09-01 ENCOUNTER — Encounter: Payer: No Typology Code available for payment source | Admitting: Rehabilitative and Restorative Service Providers"

## 2023-09-06 ENCOUNTER — Ambulatory Visit: Payer: No Typology Code available for payment source | Admitting: Rehabilitative and Restorative Service Providers"

## 2023-09-06 ENCOUNTER — Encounter: Payer: Self-pay | Admitting: Rehabilitative and Restorative Service Providers"

## 2023-09-06 DIAGNOSIS — M25551 Pain in right hip: Secondary | ICD-10-CM

## 2023-09-06 DIAGNOSIS — M5459 Other low back pain: Secondary | ICD-10-CM

## 2023-09-06 DIAGNOSIS — R252 Cramp and spasm: Secondary | ICD-10-CM

## 2023-09-06 DIAGNOSIS — M6281 Muscle weakness (generalized): Secondary | ICD-10-CM

## 2023-09-06 NOTE — Therapy (Signed)
 OUTPATIENT PHYSICAL THERAPY TREATMENT NOTE   Patient Name: Tanya Harrison MRN: [id number] DOB:[birth year], 53 y.o., female Today's Date: 09/06/2023  END OF SESSION:  PT End of Session - 09/06/23 0734     Visit Number 2    Date for PT Re-Evaluation 10/21/23    Authorization Type [employer] - [place] Preferred, no auth needed    PT Start Time 0732    PT Stop Time 0800    PT Time Calculation (min) 28 min    Activity Tolerance Patient tolerated treatment well    Behavior During Therapy WFL for tasks assessed/performed             Past Medical History:  Diagnosis Date   Anemia    Past Surgical History:  Procedure Laterality Date   ABDOMINAL HYSTERECTOMY     BREAST EXCISIONAL BIOPSY Right 09/14/2018   CSL   BREAST LUMPECTOMY WITH RADIOACTIVE SEED LOCALIZATION Right 09/14/2018   Procedure: RIGHT BREAST LUMPECTOMY WITH RADIOACTIVE SEED LOCALIZATION;  Surgeon: Harriette Bouillon, MD;  Location: [facility];  Service: General;  Laterality: Right;   CESAREAN SECTION     twice   CHOLECYSTECTOMY     LAPAROSCOPIC ASSISTED VAGINAL HYSTERECTOMY N/A 06/11/2013   Procedure: LAPAROSCOPIC ASSISTED VAGINAL HYSTERECTOMY;  Surgeon: Turner Daniels, MD;  Location: [facility];  Service: Gynecology;  Laterality: N/A;   Patient Active Problem List   Diagnosis Date Noted   S/P laparoscopic assisted vaginal hysterectomy (LAVH) 06/11/2013    PCP: Dorothyann Peng, MD  REFERRING PROVIDER: Delma Officer, PA  REFERRING DIAG: [phone] (ICD-10-CM) - Right hip pain 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  Denies actual pain, but states that she is just aware that something is there.  PERTINENT HISTORY:  I fell onto my Rt hip going down the stairs about 2 years ago.  I have had LBP and Rt hip pain on and off for 2 years but more frequent lately.   2x c-sections Lumpectomy of Rt breast 2014 - benign  PAIN:  PAIN:  Are you having pain? Yes NPRS scale: 0-2/10 now Pain location: Rt buttock primarily, groin pain, anterior hip pain, low back  Pain orientation: Right  PAIN TYPE: aching, dull, sharp, and tight Pain description: intermittent  Aggravating factors: laying on my back, exercise, bending, prolonged standing Relieving factors: ibuprofen, stretching maybe   PRECAUTIONS: None  RED FLAGS: None   WEIGHT BEARING RESTRICTIONS: No  FALLS:  Has patient fallen in last 6 months? No  LIVING ENVIRONMENT: Lives with: lives with their family Lives in: House/apartment Stairs: Yes: Internal: 9 steps; on right going up and External: 3 steps; none Has following equipment at home: None  OCCUPATION: works from home, computer  PLOF: Independent  PATIENT GOALS: continue to exercise 4-6 days a week with water classes and walking up to 2 miles without pain, be able to bend without back pain  NEXT MD VISIT: as needed for the pain  OBJECTIVE:  Note: Objective measures were completed at Evaluation unless otherwise noted.  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  PATIENT SURVEYS:  Modified Oswestry 21/50   COGNITION: Overall cognitive status: Within functional limits for tasks assessed     SENSATION: WFL  MUSCLE LENGTH: Tight bil HS, quads, hip flexors, Rt adductors, Rt piriformis, Rt gluteals, lumbar paraspinals  POSTURE: increased lumbar  lordosis  PALPATION: Tender: Rt adductors, adductor insertion along pubic ramus, iliacus, iliopsoas, glut med, glut max, piriformis, lumbar multifidi Rt L3-L5 Tight: bil thoracic paraspinals Spasm: Rt adductors, piriformis, lumbar multifidi Rt L3-L5  LUMBAR ROM:   AROM eval  Flexion Bends knees, reverses lumbar lordosis to neutral, fingers to ankles, p! Into hip  Extension 5, pain  Right lateral flexion 70% p!  Left lateral flexion 90%  Right rotation 50% p!  Left rotation 50% p!   (Blank rows = not tested)  LOWER EXTREMITY ROM:     Passive  Right eval Left eval  Hip flexion [facility]  Hip extension Limited 50% tight hip flexors Limited 50% tight hip flexors  Hip abduction Limited 30% tight adductors WFL  Hip adduction WNL WNL  Hip internal rotation WNL WNL  Hip external rotation Limited 30% WNL  Knee flexion Limited end range, tight quads Limited end range, tight quads  Knee extension WNL WNL  Ankle dorsiflexion    Ankle plantarflexion    Ankle inversion    Ankle eversion     (Blank rows = not tested)  LOWER EXTREMITY MMT:   Right hip ER 4-/5 with p! Right hip IR 4-/5 with p! Hip extension 4/5 bil Hip flexion 4/5 poor trunk stabilization Knee flexion bil 4/5 Knee extension bil 4/5 Decreased TA activation with position transitions  LUMBAR SPECIAL TESTS:  Single leg stance test: Positive on Rt, lateral trunk lean over Rt LE  FUNCTIONAL TESTS:  5 times sit to stand: 8.13  GAIT: Distance walked: within clinic Assistive device utilized: None Level of assistance: Complete Independence Comments: no major gait deviations  TREATMENT DATE:  09/06/2023: Nustep level 4 x5 min with PT present to discuss status Seated hamstring stretch 2x20 sec bilat Seated piriformis stretch 2x20 sec bilat Seated core series with 5# kettlebell:  hip to hip (slightly leaned back), hip to alt shoulder. X10 each bilat Seated modified deadlift with 5# kettlebell 2x10 Seated flexion  stretch 2x20 sec Hip adduction ball squeeze 2x10 Seated transversus abdominus activation with hands pressing down into ball 2x10 Standing paloff press with red tband 2x10  bilat Standing rows (with cuing to engage core) with red tband 2x10   08/26/23:   Initiated HEP and gave handouts Verbally introduced DN - technique, purpose, side effects Educated Pt on evaluation findings including flexibility restrictions, spasm presence, and weakness and POC for how to address                                                                                                                          PATIENT EDUCATION:  Education details: [email or web address] Person educated: Patient Education method: Programmer, multimedia, Facilities manager, and Handouts Education comprehension: verbalized understanding and returned demonstration  HOME EXERCISE PROGRAM: Access Code: [email or web address] URL: [email or web address] Date: 09/06/2023 Prepared by: Clydie Braun Tynesia Harral  Exercises - Seated Flexion Stretch  - 3 x daily - 7 x weekly - 1 sets - 3 reps - 10-20 hold - Seated Piriformis Stretch  - 3 x daily - 7 x weekly - 1 sets - 3 reps - 10-20 hold - Seated Figure 4 Piriformis Stretch  - 3 x daily - 7 x weekly - 1 sets - 3 reps - 10-20 hold - Seated Transversus Abdominis Bracing  - 1 x daily - 7 x weekly - 2 sets - 10 reps  ASSESSMENT:  CLINICAL IMPRESSION: Adiva presents to skilled PT reporting that she has been doing her stretches.  Patient able to progress with session for flexibility and core strengthening.  Patient with good participation and only minimal cuing throughout for improved posture and technique.  Patient educated on core engagement during various exercises and throughout the day to improve postural alignment.  Patient continues to require skilled PT to progress towards goal related tasks and decreased pain throughout the day and with return to her exercise classes.  OBJECTIVE IMPAIRMENTS: decreased activity tolerance,  decreased mobility, decreased ROM, decreased strength, hypomobility, increased fascial restrictions, increased muscle spasms, impaired flexibility, improper body mechanics, postural dysfunction, and pain.   ACTIVITY LIMITATIONS: carrying, lifting, bending, sitting, standing, squatting, bathing, dressing, hygiene/grooming, and locomotion level  PARTICIPATION LIMITATIONS: meal prep, cleaning, laundry, shopping, and community activity  PERSONAL FACTORS: Time since onset of injury/illness/exacerbation are also affecting patient's functional outcome.   REHAB POTENTIAL: Excellent  CLINICAL DECISION MAKING: Evolving/moderate complexity  EVALUATION COMPLEXITY: Moderate   GOALS: Goals reviewed with patient? Yes  SHORT TERM GOALS: Target date: 09/23/23  Pt will be ind with initial HEP Baseline: Goal status: INITIAL  2.  Pt will demo proper body mechanics and control with functional squat. Baseline:  Goal status: INITIAL  3.  Pt will demo improved activation of deep core with overlay of UE/LE movements such as dead bug, bird dog, standing 2023-09-21. Baseline:  Goal status: INITIAL    LONG TERM GOALS: Target date: 11/20/23  Pt will be ind with HEP and understand how to safely progress daily activities to reduce exacerbation of pain. Baseline:  Goal status: INITIAL  2.  Pt will be able to walk up to 2 miles 3-4 days a week without exacerbation of pain. Baseline:  Goal status: INITIAL  3.  Pt will be able to participate in water classes 2 days/week without exacerbation of pain Baseline:  Goal status: INITIAL  4.  Pt will demo proper form, ROM WFL, and core control for bending and squatting tasks so she will be able to perform daily tasks and ADLs without pain. Baseline:  Goal status: INITIAL  5.  Pt will be able to sit for work without exacerbation of pain. Baseline:  Goal status: INITIAL  6.  Pt will improve ODI score to </= 13/50 to demo improved function. Baseline: 21/50 Goal  status: INITIAL  PLAN:  PT FREQUENCY: 1-2x/week  PT DURATION: 8 weeks  PLANNED INTERVENTIONS: 97110-Therapeutic exercises, 97530- Therapeutic activity, [id number]- Neuromuscular re-education, 97535- Self Care, 16109- Manual therapy, 97014- Electrical stimulation (unattended), [id number]- Electrical stimulation (manual), [id number]- Traction (mechanical), [id number]- Ionotophoresis 4mg /ml Dexamethasone, Patient/Family education, Taping, Dry Needling, Joint mobilization, Spinal mobilization, Cryotherapy, and Moist heat.  PLAN FOR NEXT SESSION: DN Rt piriformis, adductors, gluteals, lumbar multifidi.  Review HEP.  Over next few visits, intro deep core in sitting and SL (has more pain in supine) and progress LE flexibility (add quads, HS, hip flexors, trunk stretches)   Reather Laurence, PT, DPT 09/06/23, 8:35 AM  [facility] [address], [address] [place], [place] [address] Phone # [phone] Fax [phone]

## 2023-09-08 ENCOUNTER — Ambulatory Visit: Payer: No Typology Code available for payment source | Admitting: Physical Therapy

## 2023-09-08 ENCOUNTER — Encounter: Payer: Self-pay | Admitting: Physical Therapy

## 2023-09-08 DIAGNOSIS — R252 Cramp and spasm: Secondary | ICD-10-CM

## 2023-09-08 DIAGNOSIS — M25551 Pain in right hip: Secondary | ICD-10-CM

## 2023-09-08 DIAGNOSIS — M6281 Muscle weakness (generalized): Secondary | ICD-10-CM

## 2023-09-08 DIAGNOSIS — M5459 Other low back pain: Secondary | ICD-10-CM

## 2023-09-08 NOTE — Patient Instructions (Signed)

## 2023-09-08 NOTE — Therapy (Signed)
 OUTPATIENT PHYSICAL THERAPY TREATMENT NOTE   Patient Name: Tanya Harrison MRN: [id number] DOB:05-May-1971, 53 y.o., female Today's Date: 09/08/2023  END OF SESSION:  PT End of Session - 09/08/23 0913     Visit Number 3    Date for PT Re-Evaluation 10/21/23    Authorization Type [employer] - [place] Preferred, no auth needed    Authorization - Number of Visits 120    PT Start Time 0800    PT Stop Time 0855    PT Time Calculation (min) 55 min    Activity Tolerance Patient tolerated treatment well    Behavior During Therapy WFL for tasks assessed/performed              Past Medical History:  Diagnosis Date   Anemia    Past Surgical History:  Procedure Laterality Date   ABDOMINAL HYSTERECTOMY     BREAST EXCISIONAL BIOPSY Right 09/14/2018   [facility]   BREAST LUMPECTOMY WITH RADIOACTIVE SEED LOCALIZATION Right 09/14/2018   Procedure: RIGHT BREAST LUMPECTOMY WITH RADIOACTIVE SEED LOCALIZATION;  Surgeon: Harriette Bouillon, MD;  Location: [facility];  Service: General;  Laterality: Right;   CESAREAN SECTION     twice   CHOLECYSTECTOMY     LAPAROSCOPIC ASSISTED VAGINAL HYSTERECTOMY N/A 06/11/2013   Procedure: LAPAROSCOPIC ASSISTED VAGINAL HYSTERECTOMY;  Surgeon: Turner Daniels, MD;  Location: [facility];  Service: Gynecology;  Laterality: N/A;   Patient Active Problem List   Diagnosis Date Noted   S/P laparoscopic assisted vaginal hysterectomy (LAVH) 06/11/2013    PCP: Dorothyann Peng, MD  REFERRING PROVIDER: Delma Officer, PA  REFERRING DIAG: [id number] (ICD-10-CM) - Right hip pain 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 She is just having some muscle soreness, but not intense pain. Pain 1-2/10  PERTINENT HISTORY:  I fell onto my Rt hip going down the stairs about 2 years ago.  I have had LBP and Rt hip pain on and off for 2 years but more frequent lately.   2x c-sections Lumpectomy of Rt breast  2014 - benign  PAIN:  PAIN:  Are you having pain? Yes NPRS scale: 0-2/10 now Pain location: Rt buttock primarily, groin pain, anterior hip pain, low back  Pain orientation: Right  PAIN TYPE: aching, dull, sharp, and tight Pain description: intermittent  Aggravating factors: laying on my back, exercise, bending, prolonged standing Relieving factors: ibuprofen, stretching maybe   PRECAUTIONS: None  RED FLAGS: None   WEIGHT BEARING RESTRICTIONS: No  FALLS:  Has patient fallen in last 6 months? No  LIVING ENVIRONMENT: Lives with: lives with their family Lives in: House/apartment Stairs: Yes: Internal: 9 steps; on right going up and External: 3 steps; none Has following equipment at home: None  OCCUPATION: works from home, computer  PLOF: Independent  PATIENT GOALS: continue to exercise 4-6 days a week with water classes and walking up to 2 miles without pain, be able to bend without back pain  NEXT MD VISIT: as needed for the pain  OBJECTIVE:  Note: Objective measures were completed at Evaluation unless otherwise noted.  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  PATIENT SURVEYS:  Modified Oswestry 21/50   COGNITION: Overall cognitive status: Within functional limits for tasks assessed     SENSATION: WFL  MUSCLE LENGTH: Tight bil HS, quads, hip flexors, Rt adductors, Rt piriformis, Rt gluteals, lumbar paraspinals  POSTURE: increased  lumbar lordosis  PALPATION: Tender: Rt adductors, adductor insertion along pubic ramus, iliacus, iliopsoas, glut med, glut max, piriformis, lumbar multifidi Rt L3-L5 Tight: bil thoracic paraspinals Spasm: Rt adductors, piriformis, lumbar multifidi Rt L3-L5  LUMBAR ROM:   AROM eval  Flexion Bends knees, reverses lumbar lordosis to neutral, fingers to ankles, p! Into hip  Extension 5, pain  Right lateral flexion 70% p!  Left lateral flexion 90%  Right rotation 50% p!  Left rotation 50% p!   (Blank rows = not tested)  LOWER EXTREMITY ROM:     Passive  Right eval Left eval  Hip flexion [facility]  Hip extension Limited 50% tight hip flexors Limited 50% tight hip flexors  Hip abduction Limited 30% tight adductors WFL  Hip adduction WNL WNL  Hip internal rotation WNL WNL  Hip external rotation Limited 30% WNL  Knee flexion Limited end range, tight quads Limited end range, tight quads  Knee extension WNL WNL  Ankle dorsiflexion    Ankle plantarflexion    Ankle inversion    Ankle eversion     (Blank rows = not tested)  LOWER EXTREMITY MMT:   Right hip ER 4-/5 with p! Right hip IR 4-/5 with p! Hip extension 4/5 bil Hip flexion 4/5 poor trunk stabilization Knee flexion bil 4/5 Knee extension bil 4/5 Decreased TA activation with position transitions  LUMBAR SPECIAL TESTS:  Single leg stance test: Positive on Rt, lateral trunk lean over Rt LE  FUNCTIONAL TESTS:  5 times sit to stand: 8.13  GAIT: Distance walked: within clinic Assistive device utilized: None Level of assistance: Complete Independence Comments: no major gait deviations  TREATMENT DATE:  09/08/2023: Nustep level 3 x4 min with PT present to discuss status Seated piriformis stretch 2x30 sec bilat Seated (leaning back) hip flexion march engaging core x 20 Seated modified deadlift with 10# kettlebell 2x10 Standing unilateral 10# KB hold + hip flexion x 20 each direction Hip adduction ball squeeze  2x10 Seated alt hand and knee press with small blue ball x 10 each [employer] the worlds 10# KB x 15 each direction Hip  adduction ball squeeze 2x10 Standing paloff press with green tband 2x10 bilat Standing rows (with cuing to engage core) with green tband 2x10 Standing shoulder ER with green TB x 20 Trigger Point Dry Needling  Initial Treatment: Pt instructed on Dry Needling rational, procedures, and possible side effects. Pt instructed to expect mild to moderate muscle soreness later in the day and/or into the next day.  Pt instructed in methods to reduce muscle soreness. Pt instructed to continue prescribed HEP. Patient verbalized understanding of these instructions and education.   Patient Verbal Consent Given: Yes Education Handout Provided: Yes Muscles Treated: Rt piriformis & gluteals; Rt lumbar multifidi  Electrical Stimulation Performed: No Treatment Response/Outcome: Utilized skilled palpation to identify trigger points.  During dry needling able to palpate muscle twitch and muscle elongation   Manual: 5 mins STM after dry needling    09/06/2023: Nustep level 4 x5 min with PT present to discuss status Seated hamstring stretch 2x20 sec bilat Seated piriformis stretch 2x20 sec bilat Seated core series with 5# kettlebell:  hip to hip (slightly leaned back), hip to alt shoulder. X10 each bilat Seated modified deadlift with 5# kettlebell 2x10 Seated flexion stretch 2x20 sec Hip adduction ball squeeze 2x10 Seated transversus abdominus activation with hands pressing down into ball 2x10 Standing paloff press with red tband 2x10 bilat Standing rows (with cuing to engage core) with red tband 2x10   08/26/23:   Initiated HEP and gave handouts Verbally introduced DN - technique, purpose, side effects Educated Pt on evaluation findings including flexibility restrictions, spasm presence, and weakness and POC for how to address                                                                                                                           PATIENT EDUCATION:  Education details: [email or web address] Person educated: Patient Education method: Programmer, multimedia, Facilities manager, and Handouts Education comprehension: verbalized understanding and returned demonstration  HOME EXERCISE PROGRAM: Access Code: [email or web address] URL: [email or web address] Date: 09/06/2023 Prepared by: Clydie Braun Menke  Exercises - Seated Flexion Stretch  - 3 x daily - 7 x weekly - 1 sets - 3 reps - 10-20 hold - Seated Piriformis Stretch  - 3 x daily - 7 x weekly - 1 sets - 3 reps - 10-20 hold - Seated Figure 4 Piriformis Stretch  - 3 x daily - 7 x weekly - 1 sets - 3 reps - 10-20 hold - Seated Transversus Abdominis Bracing  - 1 x daily - 7 x weekly - 2 sets - 10 reps  ASSESSMENT:  CLINICAL IMPRESSION: Dannelle presents to therapy with minimal pain. Progresses patient's core exercises and she required verbal and visual cues for correct exercise performance. Patient required verbal cues for correct breathing technique while performing exercises. Patient responded favorably to dry needling techniques. Palpable muscle spasms were felt during technique. Patient should continue to progress well with physical therapy. Patient will benefit from skilled PT to address the below impairments and improve overall function.  OBJECTIVE IMPAIRMENTS: decreased activity tolerance, decreased mobility, decreased ROM, decreased strength, hypomobility, increased fascial restrictions, increased muscle spasms, impaired flexibility, improper body mechanics, postural dysfunction, and pain.   ACTIVITY LIMITATIONS: carrying, lifting, bending, sitting, standing, squatting, bathing, dressing, hygiene/grooming, and locomotion level  PARTICIPATION LIMITATIONS: meal prep, cleaning, laundry, shopping, and community activity  PERSONAL FACTORS: Time since onset of injury/illness/exacerbation are also affecting patient's functional  outcome.   REHAB POTENTIAL: Excellent  CLINICAL DECISION MAKING: Evolving/moderate complexity  EVALUATION COMPLEXITY: Moderate   GOALS: Goals reviewed with patient? Yes  SHORT TERM GOALS: Target date: 09/23/23  Pt will be ind with initial HEP Baseline: Goal status: INITIAL  2.  Pt will demo proper body mechanics and control with functional squat. Baseline:  Goal status: INITIAL  3.  Pt will demo improved activation of deep core with overlay of UE/LE movements such as dead bug, bird dog, standing 24-Sep-2023. Baseline:  Goal status: INITIAL    LONG TERM GOALS: Target date: 11/20/23  Pt will be ind with HEP and understand how to safely progress daily activities to reduce exacerbation of pain. Baseline:  Goal status: INITIAL  2.  Pt will be able to walk up to 2 miles 3-4 days a week without exacerbation of pain. Baseline:  Goal status: INITIAL  3.  Pt will be able to participate in water classes 2 days/week without exacerbation of pain Baseline:  Goal status: INITIAL  4.  Pt will demo proper form, ROM WFL, and core control for bending and squatting tasks so she will be able to perform daily tasks and ADLs without pain. Baseline:  Goal status: INITIAL  5.  Pt will be able to sit for work without exacerbation of pain. Baseline:  Goal status: INITIAL  6.  Pt will improve ODI score to </= 13/50 to demo improved function. Baseline: 21/50 Goal status: INITIAL  PLAN:  PT FREQUENCY: 1-2x/week  PT DURATION: 8 weeks  PLANNED INTERVENTIONS: 97110-Therapeutic exercises, 97530- Therapeutic activity, [id number]- Neuromuscular re-education, 97535- Self Care, [id number]- Manual therapy, 97014- Electrical stimulation (unattended), [id number]- Electrical stimulation (manual), [id number]- Traction (mechanical), [id number]- Ionotophoresis 4mg /ml Dexamethasone, Patient/Family education, Taping, Dry Needling, Joint mobilization, Spinal mobilization, Cryotherapy, and Moist heat.  PLAN FOR NEXT SESSION: assess  tolerance to DN 1 ; continue if patient agrees maybe incorporated adductors; continue progressing core strengthening and flexibility (add quads, HS, hip flexors, trunk stretches)   Claude Manges, PT 09/08/23 9:15 AM [facility] [address] [place], [place] [address] Phone # [phone] Fax [phone]

## 2023-09-13 ENCOUNTER — Encounter: Payer: Self-pay | Admitting: Physical Therapy

## 2023-09-13 ENCOUNTER — Ambulatory Visit: Payer: No Typology Code available for payment source | Attending: Physician Assistant | Admitting: Physical Therapy

## 2023-09-13 DIAGNOSIS — M25551 Pain in right hip: Secondary | ICD-10-CM | POA: Diagnosis present

## 2023-09-13 DIAGNOSIS — R252 Cramp and spasm: Secondary | ICD-10-CM | POA: Diagnosis present

## 2023-09-13 DIAGNOSIS — M5459 Other low back pain: Secondary | ICD-10-CM | POA: Insufficient documentation

## 2023-09-13 DIAGNOSIS — R262 Difficulty in walking, not elsewhere classified: Secondary | ICD-10-CM | POA: Insufficient documentation

## 2023-09-13 DIAGNOSIS — M6281 Muscle weakness (generalized): Secondary | ICD-10-CM | POA: Diagnosis present

## 2023-09-13 DIAGNOSIS — R293 Abnormal posture: Secondary | ICD-10-CM | POA: Diagnosis present

## 2023-09-13 NOTE — Therapy (Signed)
 OUTPATIENT PHYSICAL THERAPY TREATMENT NOTE   Patient Name: Tanya Harrison MRN: [id number] DOB:07/26/1970, 53 y.o., female Today's Date: 09/13/2023  END OF SESSION:  PT End of Session - 09/13/23 0930     Visit Number 4    Date for PT Re-Evaluation 10/21/23    Authorization Type [employer] - [place] [employer], no auth needed    PT Start Time 0845    PT Stop Time 0927    PT Time Calculation (min) 42 min    Activity Tolerance Patient tolerated treatment well    Behavior During Therapy [facility] for tasks assessed/performed               Past Medical History:  Diagnosis Date   Anemia    Past Surgical History:  Procedure Laterality Date   ABDOMINAL HYSTERECTOMY     BREAST EXCISIONAL BIOPSY Right 09/14/2018   CSL   BREAST LUMPECTOMY WITH RADIOACTIVE SEED LOCALIZATION Right 09/14/2018   Procedure: RIGHT BREAST LUMPECTOMY WITH RADIOACTIVE SEED LOCALIZATION;  Surgeon: Harriette Bouillon, MD;  Location: [facility];  Service: General;  Laterality: Right;   CESAREAN SECTION     twice   CHOLECYSTECTOMY     LAPAROSCOPIC ASSISTED VAGINAL HYSTERECTOMY N/A 06/11/2013   Procedure: LAPAROSCOPIC ASSISTED VAGINAL HYSTERECTOMY;  Surgeon: Turner Daniels, MD;  Location: [facility];  Service: Gynecology;  Laterality: N/A;   Patient Active Problem List   Diagnosis Date Noted   S/P laparoscopic assisted vaginal hysterectomy (LAVH) 06/11/2013    PCP: Dorothyann Peng, MD  REFERRING PROVIDER: Delma Officer, PA  REFERRING DIAG: [phone] (ICD-10-CM) - Right hip pain 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 She is not currently having pain but she has an uncomfortable feeling. After needling she did not have any pain until Monday.  PERTINENT HISTORY:  I fell onto my Rt hip going down the stairs about 2 years ago.  I have had LBP and Rt hip pain on and off for 2 years but more frequent lately.   2x c-sections Lumpectomy of Rt  breast 2014 - benign  PAIN:  PAIN:  Are you having pain? Yes NPRS scale: 0-2/10 now Pain location: Rt buttock primarily, groin pain, anterior hip pain, low back  Pain orientation: Right  PAIN TYPE: aching, dull, sharp, and tight Pain description: intermittent  Aggravating factors: laying on my back, exercise, bending, prolonged standing Relieving factors: ibuprofen, stretching maybe   PRECAUTIONS: None  RED FLAGS: None   WEIGHT BEARING RESTRICTIONS: No  FALLS:  Has patient fallen in last 6 months? No  LIVING ENVIRONMENT: Lives with: lives with their family Lives in: House/apartment Stairs: Yes: Internal: 9 steps; on right going up and External: 3 steps; none Has following equipment at home: None  OCCUPATION: works from home, computer  PLOF: Independent  PATIENT GOALS: continue to exercise 4-6 days a week with water classes and walking up to 2 miles without pain, be able to bend without back pain  NEXT MD VISIT: as needed for the pain  OBJECTIVE:  Note: Objective measures were completed at Evaluation unless otherwise noted.  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  PATIENT SURVEYS:  Modified Oswestry 21/50   COGNITION: Overall cognitive status: Within functional limits for tasks assessed     SENSATION: WFL  MUSCLE LENGTH: Tight bil HS, quads, hip flexors, Rt adductors, Rt piriformis, Rt gluteals, lumbar paraspinals  POSTURE:  increased lumbar lordosis  PALPATION: Tender: Rt adductors, adductor insertion along pubic ramus, iliacus, iliopsoas, glut med, glut max, piriformis, lumbar multifidi Rt L3-L5 Tight: bil thoracic paraspinals Spasm: Rt adductors, piriformis, lumbar multifidi Rt L3-L5  LUMBAR ROM:   AROM eval  Flexion Bends knees, reverses lumbar lordosis to neutral, fingers to ankles, p! Into hip  Extension 5, pain  Right lateral flexion 70% p!  Left lateral flexion 90%  Right rotation 50% p!  Left rotation 50% p!   (Blank rows = not tested)  LOWER EXTREMITY ROM:     Passive  Right eval Left eval  Hip flexion [facility]  Hip extension Limited 50% tight hip flexors Limited 50% tight hip flexors  Hip abduction Limited 30% tight adductors WFL  Hip adduction WNL WNL  Hip internal rotation WNL WNL  Hip external rotation Limited 30% WNL  Knee flexion Limited end range, tight quads Limited end range, tight quads  Knee extension WNL WNL  Ankle dorsiflexion    Ankle plantarflexion    Ankle inversion    Ankle eversion     (Blank rows = not tested)  LOWER EXTREMITY MMT:   Right hip ER 4-/5 with p! Right hip IR 4-/5 with p! Hip extension 4/5 bil Hip flexion 4/5 poor trunk stabilization Knee flexion bil 4/5 Knee extension bil 4/5 Decreased TA activation with position transitions  LUMBAR SPECIAL TESTS:  Single leg stance test: Positive on Rt, lateral trunk lean over Rt LE  FUNCTIONAL TESTS:  5 times sit to stand: 8.13  GAIT: Distance walked: within clinic Assistive device utilized: None Level of assistance: Complete Independence Comments: no major gait deviations  TREATMENT DATE:  09/13/2023: Nustep level 3 x5 min with PT present to discuss status Hamstring stretch on 2nd stair2 x 30 bilateral Hip flexion stretch on 2nd stair 2 x 30 bilateral Standing quad stretch with chair 2 x 30 bilateral  Standing paloff press with green tband 2x10 bilat Standing rows (with cuing to engage core) with  green tband 2x10 Standing shoulder extension + core engagement with green tband 2x10 Around the worlds 10# KB  x 15 each direction Trigger Point Dry Needling  Subsequent Treatment: Instructions provided previously at initial dry needling treatment.   Patient Verbal Consent Given: Yes Education Handout Provided: Previously Provided Muscles Treated: Rt lumbar multifidi; Rt gluteals and piriformis Electrical Stimulation Performed: No Treatment Response/Outcome: Utilized skilled palpation to identify trigger points.  During dry needling able to palpate muscle twitch and muscle elongation   Manual STM after dry needling    09/08/2023: Nustep level 3 x4 min with PT present to discuss status Seated piriformis stretch 2x30 sec bilat Seated (leaning back) hip flexion march engaging core x 20 Seated modified deadlift with 10# kettlebell 2x10 Standing unilateral 10# KB hold + hip flexion x 20 each direction Hip adduction ball squeeze 2x10 Seated alt hand and knee press with small blue ball x 10 each Around the worlds 10# KB x 15 each direction Hip adduction ball squeeze 2x10 Standing paloff press with green tband 2x10 bilat Standing rows (with cuing to engage core) with green tband 2x10 Standing shoulder ER with green TB x 20 Trigger Point Dry Needling  Initial Treatment: Pt instructed on Dry Needling rational, procedures, and possible side effects. Pt instructed to expect mild to moderate muscle soreness later in the day and/or into the next day.  Pt instructed in methods to reduce muscle soreness. Pt instructed to continue prescribed HEP. Patient verbalized understanding of these instructions and education.   Patient Verbal Consent Given: Yes Education Handout Provided: Yes Muscles Treated: Rt piriformis & gluteals; Rt lumbar multifidi  Electrical Stimulation Performed: No Treatment Response/Outcome: Utilized skilled palpation to identify trigger points.  During dry needling able to  palpate muscle twitch and muscle elongation   Manual: 5 mins STM after dry needling    09/06/2023: Nustep level 4 x5 min with PT present to discuss status Seated hamstring stretch 2x20 sec bilat Seated piriformis stretch 2x20 sec bilat Seated core series with 5# kettlebell:  hip to hip (slightly leaned back), hip to alt shoulder. X10 each bilat Seated modified deadlift with 5# kettlebell 2x10 Seated flexion stretch 2x20 sec Hip adduction ball squeeze 2x10 Seated transversus abdominus activation with hands pressing down into ball 2x10 Standing paloff press with red tband 2x10 bilat Standing rows (with cuing to engage core) with red tband 2x10                                                                                                                          PATIENT EDUCATION:  Education details: [email or web address] Person educated: Patient Education method: Explanation, Demonstration, and Handouts Education comprehension: verbalized understanding and returned demonstration  HOME EXERCISE PROGRAM: Access Code: [email or web address] URL: [email or web address] Date: 09/13/2023 Prepared by: Claude Manges  Exercises - Seated Flexion Stretch  - 3 x daily - 7 x weekly - 1 sets - 3 reps - 10-20 hold - Seated Piriformis Stretch  - 3 x daily - 7 x weekly - 1 sets - 3 reps - 10-20 hold - Seated Figure 4 Piriformis Stretch  -  3 x daily - 7 x weekly - 1 sets - 3 reps - 10-20 hold - Seated Transversus Abdominis Bracing  - 1 x daily - 7 x weekly - 2 sets - 10 reps - Half Kneeling Hip Flexor Stretch with Sidebend  - 1 x daily - 7 x weekly - 2 sets - 30 hold - Quadricep Stretch with Chair and Counter Support  - 1 x daily - 7 x weekly - 2 sets - 30 hold  ASSESSMENT:  CLINICAL IMPRESSION: Kynslei presents to therapy with minimal pain. She responded well to DN #1 and verbalized relief for three days. Updated HEP to include hip and core flexibility. Patient responded well to treatment session and required  verbal and visual cues for correct exercise performance. During DN #2 palpable muscle twitches felt and patient verbalized relief. Patient will benefit from skilled PT to address the below impairments and improve overall function.     OBJECTIVE IMPAIRMENTS: decreased activity tolerance, decreased mobility, decreased ROM, decreased strength, hypomobility, increased fascial restrictions, increased muscle spasms, impaired flexibility, improper body mechanics, postural dysfunction, and pain.   ACTIVITY LIMITATIONS: carrying, lifting, bending, sitting, standing, squatting, bathing, dressing, hygiene/grooming, and locomotion level  PARTICIPATION LIMITATIONS: meal prep, cleaning, laundry, shopping, and community activity  PERSONAL FACTORS: Time since onset of injury/illness/exacerbation are also affecting patient's functional outcome.   REHAB POTENTIAL: Excellent  CLINICAL DECISION MAKING: Evolving/moderate complexity  EVALUATION COMPLEXITY: Moderate   GOALS: Goals reviewed with patient? Yes  SHORT TERM GOALS: Target date: 09/23/23  Pt will be ind with initial HEP Baseline: Goal status: INITIAL  2.  Pt will demo proper body mechanics and control with functional squat. Baseline:  Goal status: INITIAL  3.  Pt will demo improved activation of deep core with overlay of UE/LE movements such as dead bug, bird dog, standing 10/14/2023. Baseline:  Goal status: INITIAL    LONG TERM GOALS: Target date: 11/20/23  Pt will be ind with HEP and understand how to safely progress daily activities to reduce exacerbation of pain. Baseline:  Goal status: INITIAL  2.  Pt will be able to walk up to 2 miles 3-4 days a week without exacerbation of pain. Baseline:  Goal status: INITIAL  3.  Pt will be able to participate in water classes 2 days/week without exacerbation of pain Baseline:  Goal status: INITIAL  4.  Pt will demo proper form, ROM WFL, and core control for bending and squatting tasks so  she will be able to perform daily tasks and ADLs without pain. Baseline:  Goal status: INITIAL  5.  Pt will be able to sit for work without exacerbation of pain. Baseline:  Goal status: INITIAL  6.  Pt will improve ODI score to </= 13/50 to demo improved function. Baseline: 21/50 Goal status: INITIAL  PLAN:  PT FREQUENCY: 1-2x/week  PT DURATION: 8 weeks  PLANNED INTERVENTIONS: 97110-Therapeutic exercises, 97530- Therapeutic activity, [id number]- Neuromuscular re-education, 97535- Self Care, 16109- Manual therapy, 97014- Electrical stimulation (unattended), [id number]- Electrical stimulation (manual), [id number]- Traction (mechanical), [id number]- Ionotophoresis 4mg /ml Dexamethasone, Patient/Family education, Taping, Dry Needling, Joint mobilization, Spinal mobilization, Cryotherapy, and Moist heat.  PLAN FOR NEXT SESSION: assess tolerance to DN 2 ; c; continue progressing core strengthening and flexibility ; assess updated HEP  Claude Manges, PT 09/13/23 9:32 AM [facility] [address], [address] [place], [place] [address] Phone # [phone] Fax [phone]

## 2023-09-15 ENCOUNTER — Ambulatory Visit: Payer: No Typology Code available for payment source

## 2023-09-15 DIAGNOSIS — R293 Abnormal posture: Secondary | ICD-10-CM

## 2023-09-15 DIAGNOSIS — M5459 Other low back pain: Secondary | ICD-10-CM

## 2023-09-15 DIAGNOSIS — M25551 Pain in right hip: Secondary | ICD-10-CM

## 2023-09-15 DIAGNOSIS — M6281 Muscle weakness (generalized): Secondary | ICD-10-CM

## 2023-09-15 DIAGNOSIS — R262 Difficulty in walking, not elsewhere classified: Secondary | ICD-10-CM

## 2023-09-15 DIAGNOSIS — R252 Cramp and spasm: Secondary | ICD-10-CM

## 2023-09-15 NOTE — Therapy (Signed)
 OUTPATIENT PHYSICAL THERAPY TREATMENT NOTE   Patient Name: Tanya Harrison MRN: [id number] DOB:March 31, 1971, 53 y.o., female Today's Date: 09/15/2023  END OF SESSION:  PT End of Session - 09/15/23 0800     Visit Number 5    Date for PT Re-Evaluation 10/21/23    Authorization Type [employer] - [place] Preferred, no auth needed    Authorization - Number of Visits 120    PT Start Time 0801    PT Stop Time 0847    PT Time Calculation (min) 46 min    Activity Tolerance Patient tolerated treatment well    Behavior During Therapy WFL for tasks assessed/performed               Past Medical History:  Diagnosis Date   Anemia    Past Surgical History:  Procedure Laterality Date   ABDOMINAL HYSTERECTOMY     BREAST EXCISIONAL BIOPSY Right 09/14/2018   CSL   BREAST LUMPECTOMY WITH RADIOACTIVE SEED LOCALIZATION Right 09/14/2018   Procedure: RIGHT BREAST LUMPECTOMY WITH RADIOACTIVE SEED LOCALIZATION;  Surgeon: Harriette Bouillon, MD;  Location: [facility];  Service: General;  Laterality: Right;   CESAREAN SECTION     twice   CHOLECYSTECTOMY     LAPAROSCOPIC ASSISTED VAGINAL HYSTERECTOMY N/A 06/11/2013   Procedure: LAPAROSCOPIC ASSISTED VAGINAL HYSTERECTOMY;  Surgeon: Turner Daniels, MD;  Location: [facility];  Service: Gynecology;  Laterality: N/A;   Patient Active Problem List   Diagnosis Date Noted   S/P laparoscopic assisted vaginal hysterectomy (LAVH) 06/11/2013    PCP: Dorothyann Peng, MD  REFERRING PROVIDER: Delma Officer, PA  REFERRING DIAG: [id number] (ICD-10-CM) - Right hip pain 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  Rationale for Evaluation and Treatment: Rehabilitation  THERAPY DIAG:  Pain in right hip  Other low back pain  Cramp and spasm  Muscle weakness (generalized)  Difficulty in walking, not elsewhere classified  Abnormal posture  ONSET DATE: on and off for about 2 years, but more frequent lately - intially took a fall missing a step  going down the stairs  SUBJECTIVE:                                                                                                                                                                                           SUBJECTIVE STATEMENT: Patient reports "I actually feel the best I have felt in a while after last session" I did water fit yesterday and stretched in the warm pool after that class"  PERTINENT HISTORY:  I fell onto my Rt hip going down the stairs about 2 years ago.  I have had LBP and  Rt hip pain on and off for 2 years but more frequent lately.   2x c-sections Lumpectomy of Rt breast 2014 - benign  PAIN:  PAIN:  09/15/23 Are you having pain? Yes NPRS scale: 0/10 now Pain location: Rt buttock primarily, groin pain, anterior hip pain, low back  Pain orientation: Right  PAIN TYPE: aching, dull, sharp, and tight Pain description: intermittent  Aggravating factors: laying on my back, exercise, bending, prolonged standing Relieving factors: ibuprofen, stretching maybe   PRECAUTIONS: None  RED FLAGS: None   WEIGHT BEARING RESTRICTIONS: No  FALLS:  Has patient fallen in last 6 months? No  LIVING ENVIRONMENT: Lives with: lives with their family Lives in: House/apartment Stairs: Yes: Internal: 9 steps; on right going up and External: 3 steps; none Has following equipment at home: None  OCCUPATION: works from home, computer  PLOF: Independent  PATIENT GOALS: continue to exercise 4-6 days a week with water classes and walking up to 2 miles without pain, be able to bend without back pain  NEXT MD VISIT: as needed for the pain  OBJECTIVE:  Note: Objective measures were completed at Evaluation unless otherwise noted.  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  PATIENT SURVEYS:  Modified Oswestry 21/50   COGNITION: Overall cognitive status: Within functional limits for tasks assessed     SENSATION: WFL  MUSCLE  LENGTH: Tight bil HS, quads, hip flexors, Rt adductors, Rt piriformis, Rt gluteals, lumbar paraspinals  POSTURE: increased lumbar lordosis  PALPATION: Tender: Rt adductors, adductor insertion along pubic ramus, iliacus, iliopsoas, glut med, glut max, piriformis, lumbar multifidi Rt L3-L5 Tight: bil thoracic paraspinals Spasm: Rt adductors, piriformis, lumbar multifidi Rt L3-L5  LUMBAR ROM:   AROM eval  Flexion Bends knees, reverses lumbar lordosis to neutral, fingers to ankles, p! Into hip  Extension 5, pain  Right lateral flexion 70% p!  Left lateral flexion 90%  Right rotation 50% p!  Left rotation 50% p!   (Blank rows = not tested)  LOWER EXTREMITY ROM:     Passive  Right eval Left eval  Hip flexion [facility]  Hip extension Limited 50% tight hip flexors Limited 50% tight hip flexors  Hip abduction Limited 30% tight adductors WFL  Hip adduction WNL WNL  Hip internal rotation WNL WNL  Hip external rotation Limited 30% WNL  Knee flexion Limited end range, tight quads Limited end range, tight quads  Knee extension WNL WNL  Ankle dorsiflexion    Ankle plantarflexion    Ankle inversion    Ankle eversion     (Blank rows = not tested)  LOWER EXTREMITY MMT:   Right hip ER 4-/5 with p! Right hip IR 4-/5 with p! Hip extension 4/5 bil Hip flexion 4/5 poor trunk stabilization Knee flexion bil 4/5 Knee extension bil 4/5 Decreased TA activation with position transitions  LUMBAR SPECIAL TESTS:  Single leg stance test: Positive on Rt, lateral trunk lean over Rt LE  FUNCTIONAL TESTS:  5 times sit to stand: 8.13  GAIT: Distance walked: within clinic Assistive device utilized: None Level of assistance: Complete Independence Comments: no major gait deviations  TREATMENT DATE:  09/15/2023: Nustep level 3 x 5 min with PT present to discuss status Hamstring stretch on 2nd stair 2 x 30 bilateral Hip flexion stretch on 2nd stair 2 x 30 bilateral Standing quad stretch with chair  2 x 30 bilateral  Standing paloff press with green tband 2x10 bilat Standing rows (with  cuing to engage core) with green tband 2x10 Standing shoulder extension + core engagement with green tband 2x10 Around the worlds 10# KB x 15 each direction Seated piriformis stretch 2 x 30 sec each side Education on pain control and progressing HEP, stretching at work and moving around Supine PPT x 20 Supine PPT with 90/90 heel taps x 20 Supine PPT with dying bug x 20 Supine PPT with SLR to shoulder extension with 6 lb dumbbell Supine LTR x 20 (10 each side) Supine clamshell with red loop x 20 with PPT Supine bridging 2 x 10   09/13/2023: Nustep level 3 x5 min with PT present to discuss status Hamstring stretch on 2nd stair2 x 30 bilateral Hip flexion stretch on 2nd stair 2 x 30 bilateral Standing quad stretch with chair 2 x 30 bilateral  Standing paloff press with green tband 2x10 bilat Standing rows (with cuing to engage core) with green tband 2x10 Standing shoulder extension + core engagement with green tband 2x10 Around the worlds 10# KB x 15 each direction Trigger Point Dry Needling  Subsequent Treatment: Instructions provided previously at initial dry needling treatment.   Patient Verbal Consent Given: Yes Education Handout Provided: Previously Provided Muscles Treated: Rt lumbar multifidi; Rt gluteals and piriformis Electrical Stimulation Performed: No Treatment Response/Outcome: Utilized skilled palpation to identify trigger points.  During dry needling able to palpate muscle twitch and muscle elongation   Manual STM after dry needling    09/08/2023: Nustep level 3 x4 min with PT present to discuss status Seated piriformis stretch 2x30 sec bilat Seated (leaning back) hip flexion march engaging core x 20 Seated modified deadlift with 10# kettlebell 2x10 Standing unilateral 10# KB hold + hip flexion x 20 each direction Hip adduction ball squeeze 2x10 Seated alt hand and knee press  with small blue ball x 10 each Around the worlds 10# KB x 15 each direction Hip adduction ball squeeze 2x10 Standing paloff press with green tband 2x10 bilat Standing rows (with cuing to engage core) with green tband 2x10 Standing shoulder ER with green TB x 20 Trigger Point Dry Needling  Initial Treatment: Pt instructed on Dry Needling rational, procedures, and possible side effects. Pt instructed to expect mild to moderate muscle soreness later in the day and/or into the next day.  Pt instructed in methods to reduce muscle soreness. Pt instructed to continue prescribed HEP. Patient verbalized understanding of these instructions and education.   Patient Verbal Consent Given: Yes Education Handout Provided: Yes Muscles Treated: Rt piriformis & gluteals; Rt lumbar multifidi  Electrical Stimulation Performed: No Treatment Response/Outcome: Utilized skilled palpation to identify trigger points.  During dry needling able to palpate muscle twitch and muscle elongation   Manual: 5 mins STM after dry needling  PATIENT EDUCATION:  Education details: [email or web address] Person educated: Patient Education method: Explanation, Demonstration, and Handouts Education comprehension: verbalized understanding and returned demonstration  HOME EXERCISE PROGRAM: Access Code: [email or web address] URL: [email or web address] Date: 09/13/2023 Prepared by: Claude Manges  Exercises - Seated Flexion Stretch  - 3 x daily - 7 x weekly - 1 sets - 3 reps - 10-20 hold - Seated Piriformis Stretch  - 3 x daily - 7 x weekly - 1 sets - 3 reps - 10-20 hold - Seated Figure 4 Piriformis Stretch  - 3 x daily - 7 x weekly - 1 sets - 3 reps - 10-20 hold - Seated Transversus Abdominis Bracing  - 1 x daily - 7 x weekly - 2 sets - 10 reps -  Half Kneeling Hip Flexor Stretch with Sidebend  - 1 x daily - 7 x weekly - 2 sets - 30 hold - Quadricep Stretch with Chair and Counter Support  - 1 x daily - 7 x weekly - 2 sets - 30  hold  ASSESSMENT:  CLINICAL IMPRESSION: Tanya Harrison is progressing appropriately.  She was able to tolerate supine core work with no increase in pain today.  She is also working out at her local fitness facility in the pool.  She should continue to do well.   Patient will benefit from skilled PT to address the below impairments and improve overall function.   OBJECTIVE IMPAIRMENTS: decreased activity tolerance, decreased mobility, decreased ROM, decreased strength, hypomobility, increased fascial restrictions, increased muscle spasms, impaired flexibility, improper body mechanics, postural dysfunction, and pain.   ACTIVITY LIMITATIONS: carrying, lifting, bending, sitting, standing, squatting, bathing, dressing, hygiene/grooming, and locomotion level  PARTICIPATION LIMITATIONS: meal prep, cleaning, laundry, shopping, and community activity  PERSONAL FACTORS: Time since onset of injury/illness/exacerbation are also affecting patient's functional outcome.   REHAB POTENTIAL: Excellent  CLINICAL DECISION MAKING: Evolving/moderate complexity  EVALUATION COMPLEXITY: Moderate   GOALS: Goals reviewed with patient? Yes  SHORT TERM GOALS: Target date: 09/23/23  Pt will be ind with initial HEP Baseline: Goal status: INITIAL  2.  Pt will demo proper body mechanics and control with functional squat. Baseline:  Goal status: INITIAL  3.  Pt will demo improved activation of deep core with overlay of UE/LE movements such as dead bug, bird dog, standing 09-20-2023. Baseline:  Goal status: INITIAL    LONG TERM GOALS: Target date: 11/20/23  Pt will be ind with HEP and understand how to safely progress daily activities to reduce exacerbation of pain. Baseline:  Goal status: INITIAL  2.  Pt will be able to walk up to 2 miles 3-4 days a week without exacerbation of pain. Baseline:  Goal status: INITIAL  3.  Pt will be able to participate in water classes 2 days/week without exacerbation of  pain Baseline:  Goal status: INITIAL  4.  Pt will demo proper form, ROM WFL, and core control for bending and squatting tasks so she will be able to perform daily tasks and ADLs without pain. Baseline:  Goal status: INITIAL  5.  Pt will be able to sit for work without exacerbation of pain. Baseline:  Goal status: INITIAL  6.  Pt will improve ODI score to </= 13/50 to demo improved function. Baseline: 21/50 Goal status: INITIAL  PLAN:  PT FREQUENCY: 1-2x/week  PT DURATION: 8 weeks  PLANNED INTERVENTIONS: 97110-Therapeutic exercises, 97530- Therapeutic activity, [id number]- Neuromuscular re-education, 97535- Self Care, [id number]- Manual therapy, 97014- Electrical stimulation (unattended), [id number]- Electrical stimulation (manual), [id number]- Traction (mechanical), [id number]- Ionotophoresis 4mg /ml Dexamethasone, Patient/Family education, Taping, Dry Needling, Joint mobilization, Spinal mobilization, Cryotherapy, and Moist heat.  PLAN FOR NEXT SESSION: assess tolerance to DN 2 ; c; continue progressing core strengthening and flexibility ; assess updated HEP  Danielle Lento B. Obi Scrima, PT 09/15/23 8:50 AM [facility] [address] [place], [place] [address] Phone # [phone] Fax [phone]

## 2023-09-20 ENCOUNTER — Ambulatory Visit: Payer: No Typology Code available for payment source

## 2023-09-20 DIAGNOSIS — M6281 Muscle weakness (generalized): Secondary | ICD-10-CM

## 2023-09-20 DIAGNOSIS — M25551 Pain in right hip: Secondary | ICD-10-CM | POA: Diagnosis not present

## 2023-09-20 DIAGNOSIS — M5459 Other low back pain: Secondary | ICD-10-CM

## 2023-09-20 DIAGNOSIS — R252 Cramp and spasm: Secondary | ICD-10-CM

## 2023-09-20 NOTE — Therapy (Signed)
 OUTPATIENT PHYSICAL THERAPY TREATMENT NOTE   Patient Name: Tanya Harrison MRN: [id number] DOB:[birth year], 53 y.o., female Today's Date: 09/20/2023  END OF SESSION:  PT End of Session - 09/20/23 0849     Visit Number 6    Date for PT Re-Evaluation 10/21/23    Authorization Type [employer]na - [place] Preferred, no auth needed    PT Start Time 0805    PT Stop Time 0845    PT Time Calculation (min) 40 min    Activity Tolerance Patient tolerated treatment well    Behavior During Therapy [facility] for tasks assessed/performed                Past Medical History:  Diagnosis Date   Anemia    Past Surgical History:  Procedure Laterality Date   ABDOMINAL HYSTERECTOMY     BREAST EXCISIONAL BIOPSY Right 09/14/2018   CSL   BREAST LUMPECTOMY WITH RADIOACTIVE SEED LOCALIZATION Right 09/14/2018   Procedure: RIGHT BREAST LUMPECTOMY WITH RADIOACTIVE SEED LOCALIZATION;  Surgeon: Harriette Bouillon, MD;  Location: [facility];  Service: General;  Laterality: Right;   CESAREAN SECTION     twice   CHOLECYSTECTOMY     LAPAROSCOPIC ASSISTED VAGINAL HYSTERECTOMY N/A 06/11/2013   Procedure: LAPAROSCOPIC ASSISTED VAGINAL HYSTERECTOMY;  Surgeon: Turner Daniels, MD;  Location: [facility];  Service: Gynecology;  Laterality: N/A;   Patient Active Problem List   Diagnosis Date Noted   S/P laparoscopic assisted vaginal hysterectomy (LAVH) 06/11/2013    PCP: Dorothyann Peng, MD  REFERRING PROVIDER: Delma Officer, PA  REFERRING DIAG: [phone] (ICD-10-CM) - Right hip pain 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  No pain today.  [place] 12,[place] steps [place] the beach.    PERTINENT HISTORY:  I fell onto my Rt hip going down the stairs about 2 years ago.  I have had LBP and Rt hip pain on and off for 2 years but more frequent lately.   2x c-sections Lumpectomy of Rt breast 2014 - benign  PAIN:  PAIN:  09/20/23  Are you having pain? Yes NPRS scale: 0/10 now Pain location: Rt buttock primarily, groin pain, anterior hip pain, low back  Pain orientation: Right  PAIN TYPE: aching, dull, sharp, and tight Pain description: intermittent  Aggravating factors: laying on my back, exercise, bending, prolonged standing Relieving factors: ibuprofen, stretching maybe   PRECAUTIONS: None  RED FLAGS: None   WEIGHT BEARING RESTRICTIONS: No  FALLS:  Has patient fallen in last 6 months? No  LIVING ENVIRONMENT: Lives with: lives with their family Lives in: House/apartment Stairs: Yes: Internal: 9 steps; on right going up and External: 3 steps; none Has following equipment at home: None  OCCUPATION: works from home, computer  PLOF: Independent  PATIENT GOALS: continue to exercise 4-6 days a week with water classes and walking up to 2 miles without pain, be able to bend without back pain  NEXT MD VISIT: as needed for the pain  OBJECTIVE:  Note: Objective measures were completed at Evaluation unless otherwise noted.  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  PATIENT SURVEYS:  Modified Oswestry 21/50  09/20/23: Modified Oswestry 3/50=6%  COGNITION: Overall cognitive status: Within functional limits for tasks assessed     SENSATION: WFL  MUSCLE LENGTH: Tight bil HS, quads, hip flexors, Rt adductors, Rt piriformis, Rt gluteals, lumbar paraspinals  POSTURE: increased lumbar  lordosis  PALPATION: Tender: Rt adductors, adductor insertion along pubic ramus, iliacus, iliopsoas, glut med, glut max, piriformis, lumbar multifidi Rt L3-L5 Tight: bil thoracic paraspinals Spasm: Rt adductors, piriformis, lumbar multifidi Rt L3-L5  LUMBAR ROM:   AROM eval  Flexion Bends knees, reverses lumbar lordosis to neutral, fingers to ankles, p! Into hip  Extension 5, pain  Right lateral flexion 70% p!  Left lateral flexion 90%  Right rotation 50% p!  Left rotation 50% p!   (Blank rows = not tested)  LOWER EXTREMITY ROM:     Passive  Right eval Left eval  Hip flexion [facility]  Hip extension Limited 50% tight hip flexors Limited 50% tight hip flexors  Hip abduction Limited 30% tight adductors WFL  Hip adduction WNL WNL  Hip internal rotation WNL WNL  Hip external rotation Limited 30% WNL  Knee flexion Limited end range, tight quads Limited end range, tight quads  Knee extension WNL WNL  Ankle dorsiflexion    Ankle plantarflexion    Ankle inversion    Ankle eversion     (Blank rows = not tested)  LOWER EXTREMITY MMT:   Right hip ER 4-/5 with p! Right hip IR 4-/5 with p! Hip extension 4/5 bil Hip flexion 4/5 poor trunk stabilization Knee flexion bil 4/5 Knee extension bil 4/5 Decreased TA activation with position transitions  LUMBAR SPECIAL TESTS:  Single leg stance test: Positive on Rt, lateral trunk lean over Rt LE  FUNCTIONAL TESTS:  5 times sit to stand: 8.13  GAIT: Distance walked: within clinic Assistive device utilized: None Level of assistance: Complete Independence Comments: no major gait deviations  TREATMENT DATE:   09/20/2023: Nustep level 3 x 7 min with PT present to discuss status Hamstring stretch on 2nd stair 2 x 30 bilateral Standing quad stretch with chair 2 x 30 bilateral  Standing paloff press with green tband 2x10 bilat Around [place] 10# KB x 15 each direction Seated piriformis stretch 2 x 30 sec each side Low trunk  rotation x5 each Sidelying clam: red theraband  Supine bridging 2 x 10   09/15/2023: Nustep level  3 x 5 min with PT present to discuss status Hamstring stretch on 2nd stair 2 x 30 bilateral Hip flexion stretch on 2nd stair 2 x 30 bilateral Standing quad stretch with chair 2 x 30 bilateral  Standing paloff press with green tband 2x10 bilat Standing rows (with cuing to engage core) with green tband 2x10 Standing shoulder extension + core engagement with green tband 2x10 Around the worlds 10# KB x 15 each direction Seated piriformis stretch 2 x 30 sec each side Education on pain control and progressing HEP, stretching at work and moving around Supine PPT x 20 Supine PPT with 90/90 heel taps x 20 Supine PPT with dying bug x 20 Supine PPT with SLR to shoulder extension with 6 lb dumbbell Supine LTR x 20 (10 each side) Supine clamshell with red loop x 20 with PPT Supine bridging 2 x 10   09/13/2023: Nustep level 3 x5 min with PT present to discuss status Hamstring stretch on 2nd stair2 x 30 bilateral Hip flexion stretch on 2nd stair 2 x 30 bilateral Standing quad stretch with chair 2 x 30 bilateral  Standing paloff press with green tband 2x10 bilat Standing rows (with cuing to engage core) with green tband 2x10 Standing shoulder extension + core engagement with green tband 2x10 Around the worlds 10# KB x 15 each direction Trigger Point Dry Needling  Subsequent Treatment: Instructions provided previously at initial dry needling treatment.   Patient Verbal Consent Given: Yes Education Handout Provided: Previously Provided Muscles Treated: Rt lumbar multifidi; Rt gluteals and piriformis Electrical Stimulation Performed: No Treatment Response/Outcome: Utilized skilled palpation to identify trigger points.  During dry needling able to palpate muscle twitch and muscle elongation   Manual STM after dry needling   PATIENT EDUCATION:  Education details: [email or web address] Person educated:  Patient Education method: Explanation, Demonstration, and Handouts Education comprehension: verbalized understanding and returned demonstration  HOME EXERCISE PROGRAM: Access Code: [email or web address] URL: [email or web address] Date: 09/20/2023 Prepared by: Tresa Endo  Exercises - Seated Flexion Stretch  - 3 x daily - 7 x weekly - 1 sets - 3 reps - 10-20 hold - Seated Piriformis Stretch  - 3 x daily - 7 x weekly - 1 sets - 3 reps - 10-20 hold - Seated Figure 4 Piriformis Stretch  - 3 x daily - 7 x weekly - 1 sets - 3 reps - 10-20 hold - Seated Transversus Abdominis Bracing  - 1 x daily - 7 x weekly - 2 sets - 10 reps - Half Kneeling Hip Flexor Stretch with Sidebend  - 1 x daily - 7 x weekly - 2 sets - 30 hold - Quadricep Stretch with Chair and Counter Support  - 1 x daily - 7 x weekly - 2 sets - 30 hold - Standing Hamstring Stretch with Step  - 1 x daily - 7 x weekly - 2 sets - 30 hold - Standing Anti-Rotation Press with Anchored Resistance  - 1 x daily - 7 x weekly - 2 sets - 10 reps - Sit to Stand Without Arm Support  - 1 x daily - 7 x weekly - 2 sets - 10 reps - Clamshell with Resistance  - 1 x daily - 7 x weekly - 2 sets - 10 reps ASSESSMENT:  CLINICAL IMPRESSION: Pt reports 98% overall improvement in symptoms since the start of care.  She is able to sit at work and participate in water classes without increased pain.  Modified Oswestry is improved to 6% disability, meeting goal.  Pt has not tried walking for exercises and PT discussed how to gradually return to this and she will try this to determine progress toward her LTG. PT added new HEP for strength today.    Patient will benefit from skilled PT to address the below impairments and improve overall function.   OBJECTIVE IMPAIRMENTS: decreased activity tolerance, decreased mobility, decreased ROM, decreased strength, hypomobility, increased fascial restrictions, increased muscle spasms, impaired flexibility, improper body mechanics,  postural dysfunction, and pain.   ACTIVITY LIMITATIONS: carrying, lifting, bending, sitting, standing, squatting, bathing, dressing, hygiene/grooming, and locomotion level  PARTICIPATION LIMITATIONS: meal prep, cleaning, laundry, shopping, and community activity  PERSONAL FACTORS: Time since onset of injury/illness/exacerbation are also affecting patient's functional outcome.   REHAB POTENTIAL: Excellent  CLINICAL DECISION MAKING: Evolving/moderate complexity  EVALUATION COMPLEXITY: Moderate   GOALS: Goals reviewed with patient? Yes  SHORT TERM GOALS: Target date: 09/23/23  Pt will be ind with initial HEP Baseline: Goal status: MET  2.  Pt will demo proper body mechanics and control with functional squat. Baseline:  Goal status: INITIAL  3.  Pt will demo improved activation of deep core with overlay of UE/LE movements such as dead bug, bird dog, standing 2023/10/14. Baseline:  Goal status: INITIAL    LONG TERM GOALS: Target date: 11/20/23  Pt will be ind with HEP and understand how to safely progress daily activities to reduce exacerbation of pain. Baseline:  Goal status: INITIAL  2.  Pt will be able to walk up to 2 miles 3-4 days a week without exacerbation of pain. Baseline: able to walk at the beach, will try to progress walking regularly  (09/20/23) Goal status: in progress   3.  Pt will be able to participate in water classes 2 days/week without exacerbation of pain Baseline:  no pain now (09/20/23) Goal status: MET  4.  Pt will demo proper form, ROM WFL, and core control for bending and squatting tasks so she will be able to perform daily tasks and ADLs without pain. Baseline:  Goal status: INITIAL  5.  Pt will be able to sit for work without exacerbation of pain. Baseline: no pain (09/20/23) Goal status: INITIAL  6.  Pt will improve ODI score to </= 13/50 to demo improved function. Baseline: 10-13-48 (6%)- 09/20/23 Goal status: MET  PLAN:  PT FREQUENCY:  1-2x/week  PT DURATION: 8 weeks  PLANNED INTERVENTIONS: 97110-Therapeutic exercises, 97530- Therapeutic activity, 97112- Neuromuscular re-education, 97535- Self Care, 16109- Manual therapy, 97014- Electrical stimulation (unattended), [id number]- Electrical stimulation (manual), [id number]- Traction (mechanical), [id number]- Ionotophoresis 4mg /ml Dexamethasone, Patient/Family education, Taping, Dry Needling, Joint mobilization, Spinal mobilization, Cryotherapy, and Moist heat.  PLAN FOR NEXT SESSION: review new HEP updates, see how walking went   Tanya Harrison, [place] 09/20/23 8:51 AM  [facility] [address], [address] [place], [place] [address] Phone # [phone] Fax [phone]

## 2023-09-22 ENCOUNTER — Ambulatory Visit: Payer: No Typology Code available for payment source | Admitting: Rehabilitative and Restorative Service Providers"

## 2023-09-22 ENCOUNTER — Encounter: Payer: Self-pay | Admitting: Rehabilitative and Restorative Service Providers"

## 2023-09-22 DIAGNOSIS — M25551 Pain in right hip: Secondary | ICD-10-CM | POA: Diagnosis not present

## 2023-09-22 DIAGNOSIS — M5459 Other low back pain: Secondary | ICD-10-CM

## 2023-09-22 DIAGNOSIS — R252 Cramp and spasm: Secondary | ICD-10-CM

## 2023-09-22 DIAGNOSIS — M6281 Muscle weakness (generalized): Secondary | ICD-10-CM

## 2023-09-22 NOTE — Therapy (Signed)
 OUTPATIENT PHYSICAL THERAPY TREATMENT NOTE AND DISCHARGE SUMMARY   Patient Name: Tanya Harrison MRN: [id number] DOB:1971-06-07, 53 y.o., female Today's Date: 09/22/2023  END OF SESSION:  PT End of Session - 09/22/23 0733     Visit Number 7    Date for PT Re-Evaluation 10/21/23    Authorization Type [employer] - [place] Preferred, no auth needed    PT Start Time 0730    PT Stop Time 0800    PT Time Calculation (min) 30 min    Activity Tolerance Patient tolerated treatment well    Behavior During Therapy WFL for tasks assessed/performed                Past Medical History:  Diagnosis Date   Anemia    Past Surgical History:  Procedure Laterality Date   ABDOMINAL HYSTERECTOMY     BREAST EXCISIONAL BIOPSY Right 09/14/2018   CSL   BREAST LUMPECTOMY WITH RADIOACTIVE SEED LOCALIZATION Right 09/14/2018   Procedure: RIGHT BREAST LUMPECTOMY WITH RADIOACTIVE SEED LOCALIZATION;  Surgeon: Harriette Bouillon, MD;  Location: [facility];  Service: General;  Laterality: Right;   CESAREAN SECTION     twice   CHOLECYSTECTOMY     LAPAROSCOPIC ASSISTED VAGINAL HYSTERECTOMY N/A 06/11/2013   Procedure: LAPAROSCOPIC ASSISTED VAGINAL HYSTERECTOMY;  Surgeon: Turner Daniels, MD;  Location: [facility];  Service: Gynecology;  Laterality: N/A;   Patient Active Problem List   Diagnosis Date Noted   S/P laparoscopic assisted vaginal hysterectomy (LAVH) 06/11/2013    PCP: Dorothyann Peng, MD  REFERRING PROVIDER: Delma Officer, PA  REFERRING DIAG: [phone] (ICD-10-CM) - Right hip pain 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  She is able to do her normal activities without increased pain.  PERTINENT HISTORY:  I fell onto my Rt hip going down the stairs about 2 years ago.  I have had LBP and Rt hip pain on and off for 2 years but more frequent lately.   2x c-sections Lumpectomy of Rt breast 2014 - benign  PAIN:  PAIN:  Are you having pain? No    PRECAUTIONS: None  RED FLAGS: None   WEIGHT BEARING RESTRICTIONS: No  FALLS:  Has patient fallen in last 6 months? No  LIVING ENVIRONMENT: Lives with: lives with their family Lives in: House/apartment Stairs: Yes: Internal: 9 steps; on right going up and External: 3 steps; none Has following equipment at home: None  OCCUPATION: works from home, computer  PLOF: Independent  PATIENT GOALS: continue to exercise 4-6 days a week with water classes and walking up to 2 miles without pain, be able to bend without back pain  NEXT MD VISIT: as needed for the pain  OBJECTIVE:  Note: Objective measures were completed at Evaluation unless otherwise noted.  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  PATIENT SURVEYS:  Modified Oswestry 21/50  09/20/23: Modified Oswestry 3/50=6%  COGNITION: Overall cognitive status: Within functional limits for tasks assessed     SENSATION: WFL  MUSCLE LENGTH: Tight bil HS, quads, hip flexors, Rt adductors, Rt piriformis, Rt gluteals, lumbar paraspinals  POSTURE: increased lumbar lordosis  PALPATION: Tender: Rt adductors, adductor insertion along pubic ramus, iliacus, iliopsoas, glut med, glut max, piriformis, lumbar multifidi Rt L3-L5 Tight: bil thoracic paraspinals Spasm: Rt adductors, piriformis, lumbar multifidi Rt L3-L5  LUMBAR ROM:   AROM eval  Flexion Bends knees,  reverses lumbar lordosis to neutral, fingers to ankles, p! Into hip  Extension 5, pain  Right lateral flexion 70% p!  Left lateral flexion 90%  Right rotation 50% p!  Left rotation 50% p!   (Blank rows = not tested)  LOWER EXTREMITY ROM:     Passive  Right eval Left eval  Hip flexion [facility]  Hip extension Limited 50% tight hip flexors Limited 50% tight hip flexors  Hip abduction Limited 30% tight adductors WFL  Hip adduction WNL WNL  Hip internal rotation WNL WNL  Hip external rotation Limited 30% WNL  Knee flexion Limited end range, tight quads Limited end range, tight quads  Knee extension WNL WNL  Ankle dorsiflexion    Ankle plantarflexion    Ankle inversion    Ankle eversion     (Blank rows = not tested)  LOWER EXTREMITY MMT:   Right hip ER 4-/5 with p! Right hip IR 4-/5 with p! Hip extension 4/5 bil Hip flexion 4/5 poor trunk stabilization Knee flexion bil 4/5 Knee extension bil 4/5 Decreased TA activation with position transitions  LUMBAR SPECIAL TESTS:  Single leg stance test: Positive on Rt, lateral trunk lean over Rt LE  FUNCTIONAL TESTS:  5 times sit to stand: 8.13  GAIT: Distance walked: within clinic Assistive device utilized: None Level of assistance: Complete Independence Comments: no major gait deviations  TREATMENT DATE:   09/22/2023: Nustep level 5 x5 min with PT present to discuss status Hamstring stretch on 2nd stair 2 x 30 bilateral Wall squat x10 Quadruped transversus abdominus contraction 2x10 Sidelying clamshell with yellow loop 2x10 bilat Hooklying march with yellow loop 2x10 bilat Supine bridge with yellow loop 2x10 Lower trunk rotation x10   09/20/2023: Nustep level 3 x 7 min with PT present to discuss status Hamstring stretch on 2nd stair 2 x 30 bilateral Standing quad stretch with chair 2 x 30 bilateral  Standing paloff press with green tband 2x10 bilat [place] the [place] 10# KB x 15 each direction Seated piriformis stretch  2 x 30 sec each side Low trunk rotation x5 each Sidelying clam:  red theraband  Supine bridging 2 x 10   09/15/2023: Nustep level 3 x 5 min with PT present to discuss status Hamstring stretch on 2nd stair 2 x 30 bilateral Hip flexion stretch on 2nd stair 2 x 30 bilateral Standing quad stretch with chair 2 x 30 bilateral  Standing paloff press with green tband 2x10 bilat Standing rows (with cuing to engage core) with green tband 2x10 Standing shoulder extension + core engagement with green tband 2x10 [place] the worlds 10# KB x 15 each direction Seated piriformis stretch 2 x 30 sec each side Education on pain control and progressing HEP, stretching at work and moving around Supine PPT x 20 Supine PPT with 90/90 heel taps x 20 Supine PPT with dying bug x 20 Supine PPT with SLR to shoulder extension with 6 lb dumbbell Supine LTR x 20 (10 each side) Supine clamshell with red loop x 20 with PPT Supine bridging 2 x 10     PATIENT EDUCATION:  Education details: [email or web address] Person educated: Patient Education method: Explanation, Demonstration, and Handouts Education comprehension: verbalized understanding and returned demonstration  HOME EXERCISE PROGRAM: Access Code: [email or web address] URL: [email or web address] Date: 09/22/2023 Prepared by: Clydie Braun Alieyah Spader  Exercises - Seated Flexion Stretch  - 3 x daily - 7 x weekly - 1 sets - 3 reps - 10-20 hold - Seated Piriformis Stretch  - 3 x daily - 7 x weekly - 1 sets - 3 reps - 10-20 hold - Seated Figure 4 Piriformis Stretch  - 3 x daily - 7 x weekly - 1 sets - 3 reps - 10-20 hold - Seated Transversus Abdominis Bracing  - 1 x daily - 7 x weekly - 2 sets - 10 reps - Half Kneeling Hip Flexor Stretch with Sidebend  - 1 x daily - 7 x weekly - 2 sets - 30 hold - Quadricep Stretch with Chair and Counter Support  - 1 x daily - 7 x weekly - 2 sets - 30 hold - Standing Hamstring Stretch with Step  - 1 x daily - 7 x weekly - 2 sets - 30 hold -  Standing Anti-Rotation Press with Anchored Resistance  - 1 x daily - 7 x weekly - 2 sets - 10 reps - Wall Squat  - 1 x daily - 7 x weekly - 1-2 sets - 10 reps - Clamshell with Resistance  - 1 x daily - 7 x weekly - 2 sets - 10 reps - Supine March with Resistance Band  - 1 x daily - 7 x weekly - 2 sets - 10 reps - Supine Bridge with Resistance Band  - 1 x daily - 7 x weekly - 2 sets - 10 reps  ASSESSMENT:  CLINICAL IMPRESSION: Eugina presents to skilled PT reporting that she is feeling much better and ready for discharge.  Pt reports that she is able to roll over in bed without increased pain and walk and do all of the activities that were causing her pain before.  Patient has now met all goals at this time.  Patient with much improved body and postural awareness and is better able to maintain core engagement during exercises.  Patient provided with updated HEP and handout.  Patient without pain during session.  Patient discharged from outpatient PT to continue with HEP.   OBJECTIVE IMPAIRMENTS: decreased activity tolerance, decreased mobility, decreased ROM, decreased strength, hypomobility, increased fascial restrictions, increased muscle spasms, impaired flexibility, improper body mechanics, postural dysfunction, and pain.   ACTIVITY LIMITATIONS: carrying,  lifting, bending, sitting, standing, squatting, bathing, dressing, hygiene/grooming, and locomotion level  PARTICIPATION LIMITATIONS: meal prep, cleaning, laundry, shopping, and community activity  PERSONAL FACTORS: Time since onset of injury/illness/exacerbation are also affecting patient's functional outcome.   REHAB POTENTIAL: Excellent  CLINICAL DECISION MAKING: Evolving/moderate complexity  EVALUATION COMPLEXITY: Moderate   GOALS: Goals reviewed with patient? Yes  SHORT TERM GOALS: Target date: 09/23/23  Pt will be ind with initial HEP Baseline: Goal status: MET  2.  Pt will demo proper body mechanics and control with  functional squat. Baseline:  Goal status: Met  3.  Pt will demo improved activation of deep core with overlay of UE/LE movements such as dead bug, bird dog, standing 10/23/2023. Baseline:  Goal status: Met    LONG TERM GOALS: Target date: 11/20/23  Pt will be ind with HEP and understand how to safely progress daily activities to reduce exacerbation of pain. Baseline:  Goal status: Met  2.  Pt will be able to walk up to 2 miles 3-4 days a week without exacerbation of pain. Baseline: able to walk at the beach, will try to progress walking regularly  (09/20/23) Goal status: Met  3.  Pt will be able to participate in water classes 2 days/week without exacerbation of pain Baseline:  no pain now (09/20/23) Goal status: MET  4.  Pt will demo proper form, ROM WFL, and core control for bending and squatting tasks so she will be able to perform daily tasks and ADLs without pain. Baseline:  Goal status: Met  5.  Pt will be able to sit for work without exacerbation of pain. Baseline: no pain (09/20/23) Goal status: Met  6.  Pt will improve ODI score to </= 13/50 to demo improved function. Baseline: 10/22/48 (6%)- 09/20/23 Goal status: MET  PLAN:  PT FREQUENCY: 1-2x/week  PT DURATION: 8 weeks  PLANNED INTERVENTIONS: 97110-Therapeutic exercises, 97530- Therapeutic activity, 97112- Neuromuscular re-education, 97535- Self Care, 16109- Manual therapy, 97014- Electrical stimulation (unattended), [id number]- Electrical stimulation (manual), [id number]- Traction (mechanical), [id number]- Ionotophoresis 4mg /ml Dexamethasone, Patient/Family education, Taping, Dry Needling, Joint mobilization, Spinal mobilization, Cryotherapy, and Moist heat.    PHYSICAL THERAPY DISCHARGE SUMMARY  Patient agrees to discharge. Patient goals were met. Patient is being discharged due to meeting the stated rehab goals.     Reather Laurence, PT, DPT 09/22/23, 8:04 AM  [facility] [address], [address] [place], [place] [address] Phone # [phone] Fax [phone]

## 2023-09-27 ENCOUNTER — Ambulatory Visit: Payer: No Typology Code available for payment source

## 2023-10-11 ENCOUNTER — Encounter: Payer: No Typology Code available for payment source | Admitting: Rehabilitative and Restorative Service Providers"

## 2023-10-13 ENCOUNTER — Encounter: Payer: No Typology Code available for payment source | Admitting: Rehabilitative and Restorative Service Providers"

## 2023-10-20 ENCOUNTER — Encounter: Payer: No Typology Code available for payment source | Admitting: Rehabilitative and Restorative Service Providers"

## 2023-10-25 ENCOUNTER — Other Ambulatory Visit: Payer: Self-pay | Admitting: Obstetrics and Gynecology

## 2023-10-25 DIAGNOSIS — Z1231 Encounter for screening mammogram for malignant neoplasm of breast: Secondary | ICD-10-CM

## 2023-10-27 ENCOUNTER — Ambulatory Visit
Admission: RE | Admit: 2023-10-27 | Discharge: 2023-10-27 | Disposition: A | Discharge status: Home / Self Care | Source: Ambulatory Visit | Attending: Obstetrics and Gynecology | Admitting: Obstetrics and Gynecology

## 2023-10-27 DIAGNOSIS — Z1231 Encounter for screening mammogram for malignant neoplasm of breast: Secondary | ICD-10-CM
